# Patient Record
Sex: Female | Born: 1985
Health system: Southern US, Community
[De-identification: ages and names within clinical notes are randomized; demographics above are authoritative.]

## PROBLEM LIST (undated history)

## (undated) DIAGNOSIS — D649 Anemia, unspecified: Secondary | ICD-10-CM

## (undated) HISTORY — PX: OTHER SURGICAL HISTORY: SHX169

---

## 2009-08-05 HISTORY — PX: WISDOM TOOTH EXTRACTION: SHX21

## 2014-12-23 DIAGNOSIS — D509 Iron deficiency anemia, unspecified: Secondary | ICD-10-CM | POA: Insufficient documentation

## 2014-12-23 DIAGNOSIS — Z6839 Body mass index (BMI) 39.0-39.9, adult: Secondary | ICD-10-CM | POA: Insufficient documentation

## 2015-03-01 ENCOUNTER — Encounter (HOSPITAL_COMMUNITY): Payer: Self-pay | Admitting: *Deleted

## 2015-03-02 ENCOUNTER — Other Ambulatory Visit: Payer: Self-pay | Admitting: Obstetrics and Gynecology

## 2015-03-16 ENCOUNTER — Ambulatory Visit (HOSPITAL_COMMUNITY): Payer: BLUE CROSS/BLUE SHIELD | Admitting: Anesthesiology

## 2015-03-16 ENCOUNTER — Encounter (HOSPITAL_COMMUNITY)
Admission: RE | Disposition: A | Discharge status: Home / Self Care | Payer: Self-pay | Source: Ambulatory Visit | Attending: Obstetrics and Gynecology

## 2015-03-16 ENCOUNTER — Encounter (HOSPITAL_COMMUNITY): Payer: Self-pay | Admitting: Anesthesiology

## 2015-03-16 ENCOUNTER — Ambulatory Visit (HOSPITAL_COMMUNITY)
Admission: RE | Admit: 2015-03-16 | Discharge: 2015-03-16 | Disposition: A | Discharge status: Home / Self Care | Payer: BLUE CROSS/BLUE SHIELD | Source: Ambulatory Visit | Attending: Obstetrics and Gynecology | Admitting: Obstetrics and Gynecology

## 2015-03-16 DIAGNOSIS — Z3A00 Weeks of gestation of pregnancy not specified: Secondary | ICD-10-CM | POA: Diagnosis not present

## 2015-03-16 DIAGNOSIS — Z6838 Body mass index (BMI) 38.0-38.9, adult: Secondary | ICD-10-CM | POA: Insufficient documentation

## 2015-03-16 DIAGNOSIS — O034 Incomplete spontaneous abortion without complication: Secondary | ICD-10-CM | POA: Diagnosis not present

## 2015-03-16 DIAGNOSIS — N840 Polyp of corpus uteri: Secondary | ICD-10-CM | POA: Insufficient documentation

## 2015-03-16 DIAGNOSIS — D649 Anemia, unspecified: Secondary | ICD-10-CM | POA: Diagnosis not present

## 2015-03-16 DIAGNOSIS — N938 Other specified abnormal uterine and vaginal bleeding: Secondary | ICD-10-CM

## 2015-03-16 HISTORY — DX: Anemia, unspecified: D64.9

## 2015-03-16 HISTORY — PX: DILATATION & CURRETTAGE/HYSTEROSCOPY WITH RESECTOCOPE: SHX5572

## 2015-03-16 LAB — CBC
HCT: 36.9 % (ref 36.0–46.0)
HEMOGLOBIN: 12.2 g/dL (ref 12.0–15.0)
MCH: 28.1 pg (ref 26.0–34.0)
MCHC: 33.1 g/dL (ref 30.0–36.0)
MCV: 85 fL (ref 78.0–100.0)
Platelets: 280 10*3/uL (ref 150–400)
RBC: 4.34 MIL/uL (ref 3.87–5.11)
RDW: 13.9 % (ref 11.5–15.5)
WBC: 7.7 10*3/uL (ref 4.0–10.5)

## 2015-03-16 LAB — PREGNANCY, URINE: Preg Test, Ur: NEGATIVE

## 2015-03-16 SURGERY — DILATATION & CURETTAGE/HYSTEROSCOPY WITH RESECTOCOPE
Anesthesia: General | Site: Vagina

## 2015-03-16 MED ORDER — FENTANYL CITRATE (PF) 100 MCG/2ML IJ SOLN
INTRAMUSCULAR | Status: AC
  Filled 2015-03-16: qty 2

## 2015-03-16 MED ORDER — MIDAZOLAM HCL 2 MG/2ML IJ SOLN
INTRAMUSCULAR | Status: AC
  Filled 2015-03-16: qty 4

## 2015-03-16 MED ORDER — METOCLOPRAMIDE HCL 5 MG/ML IJ SOLN: 10.0000 mg | Freq: Once | INTRAMUSCULAR | Status: DC | PRN

## 2015-03-16 MED ORDER — SCOPOLAMINE 1 MG/3DAYS TD PT72
1.0000 | MEDICATED_PATCH | Freq: Once | TRANSDERMAL | Status: DC
  Administered 2015-03-16: 1.5 mg via TRANSDERMAL

## 2015-03-16 MED ORDER — MEPERIDINE HCL 25 MG/ML IJ SOLN: 6.2500 mg | INTRAMUSCULAR | Status: DC | PRN

## 2015-03-16 MED ORDER — HYDROCODONE-ACETAMINOPHEN 7.5-325 MG PO TABS: 1.0000 | ORAL_TABLET | Freq: Once | ORAL | Status: DC | PRN

## 2015-03-16 MED ORDER — ONDANSETRON HCL 4 MG/2ML IJ SOLN
INTRAMUSCULAR | Status: AC
  Filled 2015-03-16: qty 2

## 2015-03-16 MED ORDER — MIDAZOLAM HCL 5 MG/5ML IJ SOLN
INTRAMUSCULAR | Status: DC | PRN
  Administered 2015-03-16: 2 mg via INTRAVENOUS

## 2015-03-16 MED ORDER — GLYCINE 1.5 % IR SOLN
Status: DC | PRN
  Administered 2015-03-16 (×2): 3000 mL

## 2015-03-16 MED ORDER — FENTANYL CITRATE (PF) 100 MCG/2ML IJ SOLN
INTRAMUSCULAR | Status: AC
  Filled 2015-03-16: qty 4

## 2015-03-16 MED ORDER — DEXAMETHASONE SODIUM PHOSPHATE 4 MG/ML IJ SOLN
INTRAMUSCULAR | Status: AC
  Filled 2015-03-16: qty 1

## 2015-03-16 MED ORDER — DEXAMETHASONE SODIUM PHOSPHATE 4 MG/ML IJ SOLN
INTRAMUSCULAR | Status: DC | PRN
  Administered 2015-03-16: 4 mg via INTRAVENOUS

## 2015-03-16 MED ORDER — ONDANSETRON HCL 4 MG/2ML IJ SOLN
INTRAMUSCULAR | Status: DC | PRN
Start: 2015-03-16 — End: 2015-03-16
  Administered 2015-03-16: 4 mg via INTRAVENOUS

## 2015-03-16 MED ORDER — KETOROLAC TROMETHAMINE 30 MG/ML IJ SOLN
INTRAMUSCULAR | Status: AC
  Filled 2015-03-16: qty 1

## 2015-03-16 MED ORDER — IBUPROFEN 800 MG PO TABS: 800.0000 mg | ORAL_TABLET | Freq: Three times a day (TID) | ORAL | Status: DC | PRN

## 2015-03-16 MED ORDER — PROPOFOL 10 MG/ML IV BOLUS
INTRAVENOUS | Status: DC | PRN
  Administered 2015-03-16: 200 mg via INTRAVENOUS

## 2015-03-16 MED ORDER — PROPOFOL 10 MG/ML IV BOLUS
INTRAVENOUS | Status: AC
  Filled 2015-03-16: qty 20

## 2015-03-16 MED ORDER — LACTATED RINGERS IV SOLN
INTRAVENOUS | Status: DC
  Administered 2015-03-16 (×2): via INTRAVENOUS

## 2015-03-16 MED ORDER — FENTANYL CITRATE (PF) 100 MCG/2ML IJ SOLN
25.0000 ug | INTRAMUSCULAR | Status: DC | PRN
  Administered 2015-03-16: 50 ug via INTRAVENOUS

## 2015-03-16 MED ORDER — LIDOCAINE HCL (CARDIAC) 20 MG/ML IV SOLN
INTRAVENOUS | Status: DC | PRN
  Administered 2015-03-16: 80 mg via INTRAVENOUS

## 2015-03-16 MED ORDER — SCOPOLAMINE 1 MG/3DAYS TD PT72
MEDICATED_PATCH | TRANSDERMAL | Status: AC
  Administered 2015-03-16: 1.5 mg via TRANSDERMAL
  Filled 2015-03-16: qty 1

## 2015-03-16 MED ORDER — KETOROLAC TROMETHAMINE 30 MG/ML IJ SOLN
INTRAMUSCULAR | Status: DC | PRN
  Administered 2015-03-16: 30 mg via INTRAMUSCULAR
  Administered 2015-03-16: 30 mg via INTRAVENOUS

## 2015-03-16 MED ORDER — FENTANYL CITRATE (PF) 100 MCG/2ML IJ SOLN
INTRAMUSCULAR | Status: DC | PRN
  Administered 2015-03-16: 50 ug via INTRAVENOUS
  Administered 2015-03-16: 25 ug via INTRAVENOUS
  Administered 2015-03-16: 50 ug via INTRAVENOUS
  Administered 2015-03-16 (×3): 25 ug via INTRAVENOUS

## 2015-03-16 MED ORDER — LIDOCAINE HCL (CARDIAC) 20 MG/ML IV SOLN
INTRAVENOUS | Status: AC
  Filled 2015-03-16: qty 5

## 2015-03-16 SURGICAL SUPPLY — 17 items
CANISTER SUCT 3000ML (MISCELLANEOUS) ×2 IMPLANT
CATH ROBINSON RED A/P 16FR (CATHETERS) ×2 IMPLANT
CLOTH BEACON ORANGE TIMEOUT ST (SAFETY) ×2 IMPLANT
CONTAINER PREFILL 10% NBF 60ML (FORM) ×4 IMPLANT
ELECT REM PT RETURN 9FT ADLT (ELECTROSURGICAL) ×2
ELECTRODE REM PT RTRN 9FT ADLT (ELECTROSURGICAL) ×1 IMPLANT
GLOVE BIOGEL PI IND STRL 7.0 (GLOVE) ×2 IMPLANT
GLOVE BIOGEL PI INDICATOR 7.0 (GLOVE) ×2
GLOVE ECLIPSE 6.5 STRL STRAW (GLOVE) ×2 IMPLANT
GOWN STRL REUS W/TWL LRG LVL3 (GOWN DISPOSABLE) ×4 IMPLANT
LOOP ANGLED CUTTING 22FR (CUTTING LOOP) ×2 IMPLANT
PACK VAGINAL MINOR WOMEN LF (CUSTOM PROCEDURE TRAY) ×2 IMPLANT
PAD OB MATERNITY 4.3X12.25 (PERSONAL CARE ITEMS) ×2 IMPLANT
TOWEL OR 17X24 6PK STRL BLUE (TOWEL DISPOSABLE) ×4 IMPLANT
TUBING AQUILEX INFLOW (TUBING) ×2 IMPLANT
TUBING AQUILEX OUTFLOW (TUBING) ×2 IMPLANT
WATER STERILE IRR 1000ML POUR (IV SOLUTION) ×2 IMPLANT

## 2015-03-16 NOTE — Transfer of Care (Signed)
Immediate Anesthesia Transfer of Care Note  Patient: Cynthia Mckee  Procedure(s) Performed: Procedure(s): DILATATION & CURETTAGE/HYSTEROSCOPY WITH RESECTOCOPE (N/A)  Patient Location: PACU  Anesthesia Type:General  Level of Consciousness: awake, alert  and oriented  Airway & Oxygen Therapy: Patient Spontanous Breathing and Patient connected to nasal cannula oxygen  Post-op Assessment: Report given to RN and Post -op Vital signs reviewed and stable  Post vital signs: Reviewed and stable  Last Vitals:  Filed Vitals:   03/16/15 1252  BP: 126/80  Pulse: 63  Temp: 36.8 C  Resp: 16    Complications: No apparent anesthesia complications

## 2015-03-16 NOTE — Discharge Instructions (Signed)
CALL  IF TEMP>100.4, NOTHING PER VAGINA X 1 WK, CALL IF SOAKING A MAXI  PAD EVERY HOUR OR MORE FREQUENTLY  DISCHARGE INSTRUCTIONS: HYSTEROSCOPY  The following instructions have been prepared to help you care for yourself upon your return home.  May Remove Scop patch on or before 03/19/15  May take Ibuprofen after 8:10pm today.  May take stool softner while taking narcotic pain medication to prevent constipation.  Drink plenty of water.  Personal hygiene:  Use sanitary pads for vaginal drainage, not tampons.  Shower the day after your procedure.  NO tub baths, pools or Jacuzzis for 2-3 weeks.  Wipe front to back after using the bathroom.  Activity and limitations:  Do NOT drive or operate any equipment for 24 hours. The effects of anesthesia are still present and drowsiness may result.  Do NOT rest in bed all day.  Walking is encouraged.  Walk up and down stairs slowly.  You may resume your normal activity in one to two days or as indicated by your physician. Sexual activity: NO intercourse for at least 2 weeks after the procedure, or as indicated by your Doctor.  Diet: Eat a light meal as desired this evening. You may resume your usual diet tomorrow.  Return to Work: You may resume your work activities in one to two days or as indicated by Therapist, sports.  What to expect after your surgery: Expect to have vaginal bleeding/discharge for 2-3 days and spotting for up to 10 days. It is not unusual to have soreness for up to 1-2 weeks. You may have a slight burning sensation when you urinate for the first day. Mild cramps may continue for a couple of days. You may have a regular period in 2-6 weeks.  Call your doctor for any of the following:  Excessive vaginal bleeding or clotting, saturating and changing one pad every hour.  Inability to urinate 6 hours after discharge from hospital.  Pain not relieved by pain medication.  Fever of 100.4 F or greater.  Unusual  vaginal discharge or odor.  Return to office _________________Call for an appointment ___________________ Patients signature: ______________________ Nurses signature ________________________  Post Anesthesia Care Unit [phone]

## 2015-03-16 NOTE — Anesthesia Postprocedure Evaluation (Signed)
  Anesthesia Post-op Note  Patient: Cynthia Mckee  Procedure(s) Performed: Procedure(s): DILATATION & CURETTAGE/HYSTEROSCOPY WITH RESECTOCOPE (N/A)  Patient Location: PACU  Anesthesia Type:General  Level of Consciousness: awake, alert  and oriented  Airway and Oxygen Therapy: Patient Spontanous Breathing  Post-op Pain: none  Post-op Assessment: Post-op Vital signs reviewed, Patient's Cardiovascular Status Stable, Respiratory Function Stable, Patent Airway, No signs of Nausea or vomiting, Adequate PO intake and Pain level controlled              Post-op Vital Signs: Reviewed and stable  Last Vitals:  Filed Vitals:   03/16/15 1600  BP: 105/63  Pulse: 51  Temp: 36.7 C  Resp: 16    Complications: No apparent anesthesia complications

## 2015-03-16 NOTE — Anesthesia Preprocedure Evaluation (Signed)
Anesthesia Evaluation  Patient identified by MRN, date of birth, ID band Patient awake    Reviewed: Allergy & Precautions, NPO status , Patient's Chart, lab work & pertinent test results  Airway Mallampati: III  TM Distance: >3 FB Neck ROM: Full    Dental no notable dental hx. (+) Teeth Intact   Pulmonary neg pulmonary ROS,  breath sounds clear to auscultation  Pulmonary exam normal       Cardiovascular negative cardio ROS Normal cardiovascular examRhythm:Regular Rate:Normal     Neuro/Psych negative neurological ROS  negative psych ROS   GI/Hepatic negative GI ROS, Neg liver ROS,   Endo/Other  Morbid obesity  Renal/GU negative Renal ROS  negative genitourinary   Musculoskeletal negative musculoskeletal ROS (+)   Abdominal   Peds  Hematology  (+) anemia ,   Anesthesia Other Findings   Reproductive/Obstetrics DUB  Endometrial thickening Possible retained POC                             Anesthesia Physical Anesthesia Plan  ASA: III  Anesthesia Plan: General   Post-op Pain Management:    Induction: Intravenous  Airway Management Planned: LMA  Additional Equipment:   Intra-op Plan:   Post-operative Plan: Extubation in OR  Informed Consent: I have reviewed the patients History and Physical, chart, labs and discussed the procedure including the risks, benefits and alternatives for the proposed anesthesia with the patient or authorized representative who has indicated his/her understanding and acceptance.   Dental advisory given  Plan Discussed with: CRNA, Anesthesiologist and Surgeon  Anesthesia Plan Comments:         Anesthesia Quick Evaluation

## 2015-03-16 NOTE — Anesthesia Procedure Notes (Signed)
Procedure Name: LMA Insertion Date/Time: 03/16/2015 2:04 PM Performed by: Jhonnie Garner Pre-anesthesia Checklist: Patient identified, Emergency Drugs available, Suction available and Patient being monitored Patient Re-evaluated:Patient Re-evaluated prior to inductionOxygen Delivery Method: Circle system utilized Preoxygenation: Pre-oxygenation with 100% oxygen Intubation Type: IV induction Ventilation: Mask ventilation without difficulty LMA: LMA inserted LMA Size: 4.0 Number of attempts: 1 Placement Confirmation: positive ETCO2 and breath sounds checked- equal and bilateral Tube secured with: Tape Dental Injury: Teeth and Oropharynx as per pre-operative assessment

## 2015-03-16 NOTE — Brief Op Note (Signed)
03/16/2015  2:51 PM  PATIENT:  Cynthia Mckee  29 y.o. female  PRE-OPERATIVE DIAGNOSIS:  Possible Retained Products of Conception, Dysfunctional uterine bleeding, endometrial thickening on sonogram  POST-OPERATIVE DIAGNOSIS:   Retained Products of Conception, Dysfunctional uterine bleeding, endometrial polyps  PROCEDURE:  Procedure(s): DILATATION & CURETTAGE/HYSTEROSCOPY WITH RESECTOCOPE (N/A)  SURGEON:  Surgeon(s) and Role:    * Maxie Better, MD - Primary  PHYSICIAN ASSISTANT:   ASSISTANTS: none   ANESTHESIA:   general Findings: post fundal product of conception, endocervical canal polyps, left tubal ostia polyps EBL:  Total I/O In: 1000 [I.V.:1000] Out: 50 [Urine:50]  BLOOD ADMINISTERED:none  DRAINS: none   LOCAL MEDICATIONS USED:  NONE  SPECIMEN:  Source of Specimen:  poc, emc.endom polyps  DISPOSITION OF SPECIMEN:  PATHOLOGY  COUNTS:  YES  TOURNIQUET:  * No tourniquets in log *  DICTATION: .Other Dictation: Dictation Number [id number]  PLAN OF CARE: Discharge to home after PACU  PATIENT DISPOSITION:  PACU - hemodynamically stable.   Delay start of Pharmacological VTE agent (>24hrs) due to surgical blood loss or risk of bleeding: no

## 2015-03-17 ENCOUNTER — Encounter (HOSPITAL_COMMUNITY): Payer: Self-pay | Admitting: Obstetrics and Gynecology

## 2015-03-17 NOTE — Op Note (Signed)
Cynthia Mckee, Cynthia Mckee               ACCOUNT NO.:  [id number]  MEDICAL RECORD NO.:  [id number]  LOCATION:  [facility]                          FACILITY:  [facility]  PHYSICIAN:  Maxie Better, M.D.DATE OF BIRTH:  [birth year]  DATE OF PROCEDURE:  03/16/2015 DATE OF DISCHARGE:  03/16/2015                              OPERATIVE REPORT   PREOPERATIVE DIAGNOSES:  Dysfunctional uterine bleeding, endometrial thickening on sonogram, possible retained products of conception.  POSTOPERATIVE DIAGNOSES:  Retained products of conception, endometrial polyps, dysfunctional uterine bleeding.  PROCEDURES:  Diagnostic hysteroscopy, hysteroscopic resection of retained products of conception, and endometrial polyps,  dilation and Curettage.  ANESTHESIA; General  SURGEON:  Maxie Better, M.D.  ASSISTANT:  None.  DESCRIPTION OF PROCEDURE:  Under adequate general anesthesia, the patient was placed in a dorsal lithotomy position.  She was sterilely prepped and draped in usual fashion.  Bladder was catheterized for minimal amount of urine.  The patient had just voided prior to transfer to the operating room.  The patient was sterilely prepped and draped in usual fashion.  The bivalve speculum was then placed in the vagina. Single-tooth tenaculum was placed on anterior lip of the cervix.  The cervix was then serially dilated up to #27 [facility] dilator.  The resectoscope with a single loop was introduced into the uterine cavity without incident.  At that point in the posterior fundal aspect of the uterus, there was evidence of old chorionic villi type tissue which was then resected.  Both tubal ostia could subsequently be seen once the resection was done in the fundal region.  The hysteroscope was removed. The cavity was gently curetted and the resectoscope was once again inserted.  Additional tissue suspicious for products of conception was also removed.  When the cavity was felt to have been well evaluated  and products removed, the resectoscope was then slowly withdrawn from the uterus, at which time, the lower uterine segment polypoid lesions were noted x2 which were both removed.  The resectoscope finally was removed. The cavity was then gently curetted.  The resectoscope was inserted.  No other lesions were noted.  At which time, the procedure was terminated by removing all instruments from the vagina.  SPECIMEN: retained products of conception, endometrial polyps, and endometrial curetting was sent to Pathology.  ESTIMATED BLOOD LOSS:  15 mL.  FLUID DEFICIT:  35 mL.  COUNTS:  Sponge and instrument counts x2 was correct.  COMPLICATIONS:  None.  The patient tolerated the procedure well and was transferred to recovery in stable condition.     Maxie Better, M.D.     [place]/MEDQ  D:  03/16/2015  T:  03/17/2015  Job:  [id number]

## 2016-04-30 DIAGNOSIS — R875 Abnormal microbiological findings in specimens from female genital organs: Secondary | ICD-10-CM | POA: Diagnosis not present

## 2016-04-30 DIAGNOSIS — O2621 Pregnancy care for patient with recurrent pregnancy loss, first trimester: Secondary | ICD-10-CM | POA: Diagnosis not present

## 2016-04-30 DIAGNOSIS — Z3A12 12 weeks gestation of pregnancy: Secondary | ICD-10-CM | POA: Diagnosis not present

## 2016-04-30 DIAGNOSIS — Z3481 Encounter for supervision of other normal pregnancy, first trimester: Secondary | ICD-10-CM | POA: Diagnosis not present

## 2016-04-30 DIAGNOSIS — Z8279 Family history of other congenital malformations, deformations and chromosomal abnormalities: Secondary | ICD-10-CM | POA: Diagnosis not present

## 2016-04-30 DIAGNOSIS — Z113 Encounter for screening for infections with a predominantly sexual mode of transmission: Secondary | ICD-10-CM | POA: Diagnosis not present

## 2016-04-30 LAB — OB RESULTS CONSOLE HEPATITIS B SURFACE ANTIGEN: HEP B S AG: NEGATIVE

## 2016-04-30 LAB — OB RESULTS CONSOLE GC/CHLAMYDIA
CHLAMYDIA, DNA PROBE: NEGATIVE
Gonorrhea: NEGATIVE

## 2016-04-30 LAB — OB RESULTS CONSOLE RPR: RPR: NONREACTIVE

## 2016-04-30 LAB — OB RESULTS CONSOLE ABO/RH: RH TYPE: POSITIVE

## 2016-04-30 LAB — OB RESULTS CONSOLE HIV ANTIBODY (ROUTINE TESTING): HIV: NONREACTIVE

## 2016-04-30 LAB — OB RESULTS CONSOLE RUBELLA ANTIBODY, IGM: Rubella: IMMUNE

## 2016-04-30 LAB — OB RESULTS CONSOLE ANTIBODY SCREEN: ANTIBODY SCREEN: NEGATIVE

## 2016-05-28 DIAGNOSIS — O2622 Pregnancy care for patient with recurrent pregnancy loss, second trimester: Secondary | ICD-10-CM | POA: Diagnosis not present

## 2016-05-28 DIAGNOSIS — Z361 Encounter for antenatal screening for raised alphafetoprotein level: Secondary | ICD-10-CM | POA: Diagnosis not present

## 2016-05-28 DIAGNOSIS — Z3A16 16 weeks gestation of pregnancy: Secondary | ICD-10-CM | POA: Diagnosis not present

## 2016-06-11 DIAGNOSIS — Z363 Encounter for antenatal screening for malformations: Secondary | ICD-10-CM | POA: Diagnosis not present

## 2016-07-02 DIAGNOSIS — Z363 Encounter for antenatal screening for malformations: Secondary | ICD-10-CM | POA: Diagnosis not present

## 2016-08-05 NOTE — L&D Delivery Note (Signed)
Delivery Note At 2:08 AM a viable and healthy female was delivered via Vaginal, Spontaneous Delivery (Presentation:ROA ;vtx  ).  APGAR: 7, 8; weight pending .   Placenta status:spontaneous, intact not sent , .  Cord: CAN x1 reducible with the following complications: .  Cord pH: none  Anesthesia:  epidural Episiotomy: None Lacerations: Sulcus;Labial Suture Repair: 3.0 chromic Est. Blood Loss (mL): 350  Mom to postpartum.  Baby to Couplet care / Skin to Skin.  Samael Blades A 11/01/2016, 2:38 AM

## 2016-08-29 DIAGNOSIS — Z3A30 30 weeks gestation of pregnancy: Secondary | ICD-10-CM | POA: Diagnosis not present

## 2016-08-29 DIAGNOSIS — Z3689 Encounter for other specified antenatal screening: Secondary | ICD-10-CM | POA: Diagnosis not present

## 2016-08-29 DIAGNOSIS — O2623 Pregnancy care for patient with recurrent pregnancy loss, third trimester: Secondary | ICD-10-CM | POA: Diagnosis not present

## 2016-09-09 DIAGNOSIS — Z23 Encounter for immunization: Secondary | ICD-10-CM | POA: Diagnosis not present

## 2016-09-24 ENCOUNTER — Encounter (HOSPITAL_COMMUNITY): Payer: Self-pay

## 2016-09-24 DIAGNOSIS — Z3A33 33 weeks gestation of pregnancy: Secondary | ICD-10-CM | POA: Diagnosis not present

## 2016-09-24 DIAGNOSIS — O3663X0 Maternal care for excessive fetal growth, third trimester, not applicable or unspecified: Secondary | ICD-10-CM | POA: Diagnosis not present

## 2016-10-02 ENCOUNTER — Encounter (HOSPITAL_COMMUNITY): Payer: Self-pay

## 2016-10-02 ENCOUNTER — Other Ambulatory Visit (HOSPITAL_COMMUNITY): Payer: Self-pay | Admitting: Obstetrics and Gynecology

## 2016-10-02 DIAGNOSIS — Z3A35 35 weeks gestation of pregnancy: Secondary | ICD-10-CM

## 2016-10-02 DIAGNOSIS — IMO0002 Reserved for concepts with insufficient information to code with codable children: Secondary | ICD-10-CM

## 2016-10-02 DIAGNOSIS — Z3689 Encounter for other specified antenatal screening: Secondary | ICD-10-CM

## 2016-10-03 ENCOUNTER — Ambulatory Visit (HOSPITAL_COMMUNITY)
Admission: RE | Admit: 2016-10-03 | Discharge: 2016-10-03 | Disposition: A | Discharge status: Home / Self Care | Payer: 59 | Source: Ambulatory Visit | Attending: Obstetrics and Gynecology | Admitting: Obstetrics and Gynecology

## 2016-10-03 ENCOUNTER — Encounter (HOSPITAL_COMMUNITY): Payer: Self-pay

## 2016-10-03 DIAGNOSIS — Z3689 Encounter for other specified antenatal screening: Secondary | ICD-10-CM | POA: Insufficient documentation

## 2016-10-03 DIAGNOSIS — Q897 Multiple congenital malformations, not elsewhere classified: Secondary | ICD-10-CM | POA: Diagnosis not present

## 2016-10-03 DIAGNOSIS — Z363 Encounter for antenatal screening for malformations: Secondary | ICD-10-CM | POA: Diagnosis not present

## 2016-10-03 DIAGNOSIS — IMO0002 Reserved for concepts with insufficient information to code with codable children: Secondary | ICD-10-CM

## 2016-10-03 DIAGNOSIS — O283 Abnormal ultrasonic finding on antenatal screening of mother: Secondary | ICD-10-CM | POA: Diagnosis not present

## 2016-10-03 DIAGNOSIS — Z3A35 35 weeks gestation of pregnancy: Secondary | ICD-10-CM | POA: Diagnosis not present

## 2016-10-07 DIAGNOSIS — Z3A35 35 weeks gestation of pregnancy: Secondary | ICD-10-CM | POA: Diagnosis not present

## 2016-10-07 DIAGNOSIS — O3663X0 Maternal care for excessive fetal growth, third trimester, not applicable or unspecified: Secondary | ICD-10-CM | POA: Diagnosis not present

## 2016-10-07 DIAGNOSIS — Z3685 Encounter for antenatal screening for Streptococcus B: Secondary | ICD-10-CM | POA: Diagnosis not present

## 2016-10-07 LAB — OB RESULTS CONSOLE GBS: GBS: NEGATIVE

## 2016-10-14 DIAGNOSIS — O3693X0 Maternal care for fetal problem, unspecified, third trimester, not applicable or unspecified: Secondary | ICD-10-CM | POA: Insufficient documentation

## 2016-10-14 DIAGNOSIS — O358XX0 Maternal care for other (suspected) fetal abnormality and damage, not applicable or unspecified: Secondary | ICD-10-CM | POA: Diagnosis not present

## 2016-10-14 DIAGNOSIS — Z3A36 36 weeks gestation of pregnancy: Secondary | ICD-10-CM | POA: Diagnosis not present

## 2016-10-16 DIAGNOSIS — O3663X0 Maternal care for excessive fetal growth, third trimester, not applicable or unspecified: Secondary | ICD-10-CM | POA: Diagnosis not present

## 2016-10-16 DIAGNOSIS — Z3A36 36 weeks gestation of pregnancy: Secondary | ICD-10-CM | POA: Diagnosis not present

## 2016-10-21 DIAGNOSIS — O403XX0 Polyhydramnios, third trimester, not applicable or unspecified: Secondary | ICD-10-CM | POA: Diagnosis not present

## 2016-10-21 DIAGNOSIS — Z3A37 37 weeks gestation of pregnancy: Secondary | ICD-10-CM | POA: Diagnosis not present

## 2016-10-28 DIAGNOSIS — O403XX0 Polyhydramnios, third trimester, not applicable or unspecified: Secondary | ICD-10-CM | POA: Diagnosis not present

## 2016-10-28 DIAGNOSIS — Z3A38 38 weeks gestation of pregnancy: Secondary | ICD-10-CM | POA: Diagnosis not present

## 2016-10-31 ENCOUNTER — Other Ambulatory Visit: Payer: Self-pay | Admitting: Obstetrics and Gynecology

## 2016-10-31 ENCOUNTER — Inpatient Hospital Stay (HOSPITAL_COMMUNITY): Payer: 59 | Admitting: Anesthesiology

## 2016-10-31 ENCOUNTER — Inpatient Hospital Stay (HOSPITAL_COMMUNITY)
Admission: AD | Admit: 2016-10-31 | Discharge: 2016-11-02 | DRG: 775 | Disposition: A | Discharge status: Home / Self Care | Payer: 59 | Source: Ambulatory Visit | Attending: Obstetrics and Gynecology | Admitting: Obstetrics and Gynecology

## 2016-10-31 ENCOUNTER — Encounter (HOSPITAL_COMMUNITY): Payer: Self-pay | Admitting: *Deleted

## 2016-10-31 DIAGNOSIS — Z3A39 39 weeks gestation of pregnancy: Secondary | ICD-10-CM | POA: Diagnosis not present

## 2016-10-31 DIAGNOSIS — O403XX0 Polyhydramnios, third trimester, not applicable or unspecified: Secondary | ICD-10-CM | POA: Diagnosis present

## 2016-10-31 DIAGNOSIS — O358XX0 Maternal care for other (suspected) fetal abnormality and damage, not applicable or unspecified: Secondary | ICD-10-CM | POA: Diagnosis present

## 2016-10-31 DIAGNOSIS — Z3490 Encounter for supervision of normal pregnancy, unspecified, unspecified trimester: Secondary | ICD-10-CM

## 2016-10-31 LAB — CBC
HEMATOCRIT: 33.7 % — AB (ref 36.0–46.0)
Hemoglobin: 11.3 g/dL — ABNORMAL LOW (ref 12.0–15.0)
MCH: 27.8 pg (ref 26.0–34.0)
MCHC: 33.5 g/dL (ref 30.0–36.0)
MCV: 82.8 fL (ref 78.0–100.0)
PLATELETS: 259 10*3/uL (ref 150–400)
RBC: 4.07 MIL/uL (ref 3.87–5.11)
RDW: 13.9 % (ref 11.5–15.5)
WBC: 10.9 10*3/uL — AB (ref 4.0–10.5)

## 2016-10-31 LAB — TYPE AND SCREEN
ABO/RH(D): O POS
ANTIBODY SCREEN: NEGATIVE

## 2016-10-31 LAB — ABO/RH: ABO/RH(D): O POS

## 2016-10-31 MED ORDER — DIPHENHYDRAMINE HCL 50 MG/ML IJ SOLN: 12.5000 mg | INTRAMUSCULAR | Status: DC | PRN

## 2016-10-31 MED ORDER — OXYTOCIN BOLUS FROM INFUSION
500.0000 mL | Freq: Once | INTRAVENOUS | Status: AC
Start: 2016-10-31 — End: 2016-11-01
  Administered 2016-11-01: 500 mL via INTRAVENOUS

## 2016-10-31 MED ORDER — OXYTOCIN 40 UNITS IN LACTATED RINGERS INFUSION - SIMPLE MED: 2.5000 [IU]/h | INTRAVENOUS | Status: DC

## 2016-10-31 MED ORDER — PHENYLEPHRINE 40 MCG/ML (10ML) SYRINGE FOR IV PUSH (FOR BLOOD PRESSURE SUPPORT)
80.0000 ug | PREFILLED_SYRINGE | INTRAVENOUS | Status: DC | PRN
  Filled 2016-10-31: qty 5
  Filled 2016-10-31: qty 10

## 2016-10-31 MED ORDER — LACTATED RINGERS IV SOLN: 500.0000 mL | INTRAVENOUS | Status: DC | PRN

## 2016-10-31 MED ORDER — LACTATED RINGERS IV SOLN: 500.0000 mL | Freq: Once | INTRAVENOUS | Status: DC

## 2016-10-31 MED ORDER — SOD CITRATE-CITRIC ACID 500-334 MG/5ML PO SOLN: 30.0000 mL | ORAL | Status: DC | PRN

## 2016-10-31 MED ORDER — PHENYLEPHRINE 40 MCG/ML (10ML) SYRINGE FOR IV PUSH (FOR BLOOD PRESSURE SUPPORT)
80.0000 ug | PREFILLED_SYRINGE | INTRAVENOUS | Status: DC | PRN
  Filled 2016-10-31: qty 5

## 2016-10-31 MED ORDER — EPHEDRINE 5 MG/ML INJ
10.0000 mg | INTRAVENOUS | Status: DC | PRN
  Filled 2016-10-31: qty 2

## 2016-10-31 MED ORDER — ACETAMINOPHEN 325 MG PO TABS: 650.0000 mg | ORAL_TABLET | ORAL | Status: DC | PRN

## 2016-10-31 MED ORDER — LIDOCAINE HCL (PF) 1 % IJ SOLN
30.0000 mL | INTRAMUSCULAR | Status: DC | PRN
  Administered 2016-11-01: 30 mL via SUBCUTANEOUS
  Filled 2016-10-31: qty 30

## 2016-10-31 MED ORDER — OXYTOCIN 40 UNITS IN LACTATED RINGERS INFUSION - SIMPLE MED
1.0000 m[IU]/min | INTRAVENOUS | Status: DC
  Administered 2016-10-31: 2 m[IU]/min via INTRAVENOUS
  Filled 2016-10-31: qty 1000

## 2016-10-31 MED ORDER — EPHEDRINE 5 MG/ML INJ: 10.0000 mg | INTRAVENOUS | Status: DC | PRN

## 2016-10-31 MED ORDER — LACTATED RINGERS IV SOLN
INTRAVENOUS | Status: DC
  Administered 2016-10-31 (×2): via INTRAVENOUS

## 2016-10-31 MED ORDER — LIDOCAINE HCL (PF) 1 % IJ SOLN
INTRAMUSCULAR | Status: DC | PRN
  Administered 2016-10-31: 4 mL via EPIDURAL

## 2016-10-31 MED ORDER — TERBUTALINE SULFATE 1 MG/ML IJ SOLN
0.2500 mg | Freq: Once | INTRAMUSCULAR | Status: DC | PRN
  Filled 2016-10-31: qty 1

## 2016-10-31 MED ORDER — LACTATED RINGERS IV SOLN
500.0000 mL | Freq: Once | INTRAVENOUS | Status: AC
  Administered 2016-10-31: 500 mL via INTRAVENOUS

## 2016-10-31 MED ORDER — FENTANYL 2.5 MCG/ML BUPIVACAINE 1/10 % EPIDURAL INFUSION (WH - ANES)
14.0000 mL/h | INTRAMUSCULAR | Status: DC | PRN
  Administered 2016-10-31: 14 mL/h via EPIDURAL
  Filled 2016-10-31: qty 100

## 2016-10-31 MED ORDER — PHENYLEPHRINE 40 MCG/ML (10ML) SYRINGE FOR IV PUSH (FOR BLOOD PRESSURE SUPPORT): 80.0000 ug | PREFILLED_SYRINGE | INTRAVENOUS | Status: DC | PRN

## 2016-10-31 MED ORDER — ONDANSETRON HCL 4 MG/2ML IJ SOLN: 4.0000 mg | Freq: Four times a day (QID) | INTRAMUSCULAR | Status: DC | PRN

## 2016-10-31 NOTE — H&P (Signed)
Cynthia Mckee is a 31 y.o. female presenting for IOL @ 39 weeks due to polyhydramnios( AFI 34). PNC also complicated by 8.7 cm fetal cyst OB History    Gravida Para Term Preterm AB Living   5       4     SAB TAB Ectopic Multiple Live Births   4             Past Medical History:  Diagnosis Date  . Anemia    one year ago, last check was normal   Past Surgical History:  Procedure Laterality Date  . DILATATION & CURRETTAGE/HYSTEROSCOPY WITH RESECTOCOPE N/A 03/16/2015   Procedure: DILATATION & CURETTAGE/HYSTEROSCOPY WITH RESECTOCOPE;  Surgeon: Maxie BetterSheronette Zayyan Mullen, MD;  Location: [facility];  Service: Gynecology;  Laterality: N/A;  . WISDOM TOOTH EXTRACTION  2011   Family History: family history is not on file. Social History:  reports that she has never smoked. She has never used smokeless tobacco. She reports that she does not drink alcohol or use drugs.     Maternal Diabetes: No Genetic Screening: Normal( absent NB). Nl panaroma Maternal Ultrasounds/Referrals: Normal Fetal Ultrasounds or other Referrals:  Referred to Materal Fetal Medicine , Other:  Maternal Substance Abuse:  No Significant Maternal Medications:  None Significant Maternal Lab Results:  Lab values include: Group B Strep negative Other Comments:  large fetal cyst ? ovarian  Review of Systems  All other systems reviewed and are negative.  Maternal Medical History:  Fetal activity: Perceived fetal activity is normal.    Prenatal complications: Polyhydramnios.   Prenatal Complications - Diabetes: none.      Last menstrual period 02/01/2016. Maternal Exam:  Abdomen: Patient reports no abdominal tenderness. Fetal presentation: vertex  Pelvis: adequate for delivery.   Cervix: Cervix evaluated by digital exam.     Physical Exam  Constitutional: She is oriented to person, place, and time. She appears well-developed and well-nourished.  HENT:  Head: Atraumatic.  Eyes: EOM are normal.  Neck: Neck supple.   Cardiovascular: Regular rhythm.   Murmur heard. Respiratory: Breath sounds normal.  GI: Soft.  Musculoskeletal: Normal range of motion.  Neurological: She is alert and oriented to person, place, and time.  Skin: Skin is warm and dry.    Prenatal labs: ABO, Rh: O/Positive/-- (09/26 0000) Antibody: Negative (09/26 0000) Rubella: Immune (09/26 0000) RPR: Nonreactive (09/26 0000)  HBsAg: Negative (09/26 0000)  HIV: Non-reactive (09/26 0000)  GBS: Negative (03/05 0000)   Assessment/Plan: Polyhydramnios Term gestation Fetal cyst P) admit routine labs. Pitocin induction. Analgesic prn Angas Isabell A 10/31/2016, 4:59 PM

## 2016-10-31 NOTE — Progress Notes (Signed)
S; some ctx  O: pitocin VE 4-5/80-90/-1 AROM clear fluid ISE/IUPC placed  Tracing: baseline 140 (+) accels Ctx q 2-3 mins  IMP: polyhydramnios Term gestation Fetal cyst P) analgesic prn. Cont pitocin

## 2016-10-31 NOTE — Anesthesia Pain Management Evaluation Note (Signed)
  CRNA Pain Management Visit Note  Patient: Cynthia Mckee, 31 y.o., female  "Hello I am a [facility] the anesthesia [facility]. We have an anesthesia team available at all times to provide care throughout the hospital, including epidural management and anesthesia for C-section. I don't know your plan for the delivery whether it a natural birth, water birth, IV sedation, nitrous supplementation, doula or epidural, but we want to meet your pain goals."   1.Was your pain managed to your expectations on prior hospitalizations?   No prior hospitalizations  2.What is your expectation for pain management during this hospitalization?     Epidural  3.How can we help you reach that goal?   Record the patient's initial score and the patient's pain goal.   Pain: 1  Pain Goal: 8 [facility] wants you to be able to say your pain was always managed very well.  Cynthia Mckee,Cynthia Mckee 10/31/2016

## 2016-10-31 NOTE — Anesthesia Procedure Notes (Signed)
Epidural Patient location during procedure: OB Start time: 10/31/2016 11:39 PM End time: 10/31/2016 11:45 PM  Staffing Anesthesiologist: Shona SimpsonHOLLIS, Arleta Ostrum D Performed: anesthesiologist   Preanesthetic Checklist Completed: patient identified, site marked, surgical consent, pre-op evaluation, timeout performed, IV checked, risks and benefits discussed and monitors and equipment checked  Epidural Patient position: sitting Prep: ChloraPrep Patient monitoring: heart rate, continuous pulse ox and blood pressure Approach: midline Location: L3-L4 Injection technique: LOR saline  Needle:  Needle type: Tuohy  Needle gauge: 17 G Needle length: 9 cm Catheter type: closed end flexible Catheter size: 20 Guage Test dose: negative and 1.5% lidocaine  Assessment Events: blood not aspirated, injection not painful, no injection resistance and no paresthesia  Additional Notes LOR @ 6.5  Patient identified. Risks/Benefits/Options discussed with patient including but not limited to bleeding, infection, nerve damage, paralysis, failed block, incomplete pain control, headache, blood pressure changes, nausea, vomiting, reactions to medications, itching and postpartum back pain. Confirmed with bedside nurse the patient's most recent platelet count. Confirmed with patient that they are not currently taking any anticoagulation, have any bleeding history or any family history of bleeding disorders. Patient expressed understanding and wished to proceed. All questions were answered. Sterile technique was used throughout the entire procedure. Please see nursing notes for vital signs. Test dose was given through epidural catheter and negative prior to continuing to dose epidural or start infusion. Warning signs of high block given to the patient including shortness of breath, tingling/numbness in hands, complete motor block, or any concerning symptoms with instructions to call for help. Patient was given instructions on  fall risk and not to get out of bed. All questions and concerns addressed with instructions to call with any issues or inadequate analgesia.    Reason for block:procedure for pain

## 2016-10-31 NOTE — Anesthesia Preprocedure Evaluation (Signed)
Anesthesia Evaluation  Patient identified by MRN, date of birth, ID band Patient awake    Reviewed: Allergy & Precautions, Patient's Chart, lab work & pertinent test results  Airway Mallampati: III       Dental  (+) Teeth Intact   Pulmonary neg pulmonary ROS,    breath sounds clear to auscultation       Cardiovascular negative cardio ROS   Rhythm:Regular Rate:Normal     Neuro/Psych negative neurological ROS  negative psych ROS   GI/Hepatic negative GI ROS, Neg liver ROS,   Endo/Other  negative endocrine ROS  Renal/GU negative Renal ROS  negative genitourinary   Musculoskeletal negative musculoskeletal ROS (+)   Abdominal   Peds negative pediatric ROS (+)  Hematology negative hematology ROS (+)   Anesthesia Other Findings   Reproductive/Obstetrics (+) Pregnancy                             Lab Results  Component Value Date   WBC 10.9 (H) 10/31/2016   HGB 11.3 (L) 10/31/2016   HCT 33.7 (L) 10/31/2016   MCV 82.8 10/31/2016   PLT 259 10/31/2016     Anesthesia Physical Anesthesia Plan  ASA: III  Anesthesia Plan: Epidural   Post-op Pain Management:    Induction:   Airway Management Planned:   Additional Equipment:   Intra-op Plan:   Post-operative Plan:   Informed Consent: I have reviewed the patients History and Physical, chart, labs and discussed the procedure including the risks, benefits and alternatives for the proposed anesthesia with the patient or authorized representative who has indicated his/her understanding and acceptance.     Plan Discussed with: CRNA  Anesthesia Plan Comments:         Anesthesia Quick Evaluation

## 2016-11-01 ENCOUNTER — Encounter (HOSPITAL_COMMUNITY): Payer: Self-pay

## 2016-11-01 LAB — CBC
HEMATOCRIT: 30.5 % — AB (ref 36.0–46.0)
Hemoglobin: 9.9 g/dL — ABNORMAL LOW (ref 12.0–15.0)
MCH: 27.3 pg (ref 26.0–34.0)
MCHC: 32.5 g/dL (ref 30.0–36.0)
MCV: 84 fL (ref 78.0–100.0)
Platelets: 213 10*3/uL (ref 150–400)
RBC: 3.63 MIL/uL — ABNORMAL LOW (ref 3.87–5.11)
RDW: 13.7 % (ref 11.5–15.5)
WBC: 13.7 10*3/uL — AB (ref 4.0–10.5)

## 2016-11-01 LAB — RPR: RPR: NONREACTIVE

## 2016-11-01 MED ORDER — DIPHENHYDRAMINE HCL 25 MG PO CAPS: 25.0000 mg | ORAL_CAPSULE | Freq: Four times a day (QID) | ORAL | Status: DC | PRN

## 2016-11-01 MED ORDER — BENZOCAINE-MENTHOL 20-0.5 % EX AERO
1.0000 "application " | INHALATION_SPRAY | CUTANEOUS | Status: DC | PRN
  Administered 2016-11-01: 1 via TOPICAL
  Filled 2016-11-01: qty 56

## 2016-11-01 MED ORDER — ONDANSETRON HCL 4 MG PO TABS: 4.0000 mg | ORAL_TABLET | ORAL | Status: DC | PRN

## 2016-11-01 MED ORDER — ACETAMINOPHEN 325 MG PO TABS: 650.0000 mg | ORAL_TABLET | ORAL | Status: DC | PRN

## 2016-11-01 MED ORDER — IBUPROFEN 600 MG PO TABS
600.0000 mg | ORAL_TABLET | Freq: Four times a day (QID) | ORAL | Status: DC
  Administered 2016-11-01 – 2016-11-02 (×6): 600 mg via ORAL
  Filled 2016-11-01 (×6): qty 1

## 2016-11-01 MED ORDER — OXYCODONE HCL 5 MG PO TABS: 5.0000 mg | ORAL_TABLET | ORAL | Status: DC | PRN

## 2016-11-01 MED ORDER — ONDANSETRON HCL 4 MG/2ML IJ SOLN: 4.0000 mg | INTRAMUSCULAR | Status: DC | PRN

## 2016-11-01 MED ORDER — COCONUT OIL OIL: 1.0000 "application " | TOPICAL_OIL | Status: DC | PRN

## 2016-11-01 MED ORDER — SENNOSIDES-DOCUSATE SODIUM 8.6-50 MG PO TABS
2.0000 | ORAL_TABLET | ORAL | Status: DC
  Administered 2016-11-01: 2 via ORAL
  Filled 2016-11-01: qty 2

## 2016-11-01 MED ORDER — OXYCODONE HCL 5 MG PO TABS: 10.0000 mg | ORAL_TABLET | ORAL | Status: DC | PRN

## 2016-11-01 MED ORDER — WITCH HAZEL-GLYCERIN EX PADS: 1.0000 "application " | MEDICATED_PAD | CUTANEOUS | Status: DC | PRN

## 2016-11-01 MED ORDER — DIBUCAINE 1 % RE OINT
1.0000 "application " | TOPICAL_OINTMENT | RECTAL | Status: DC | PRN
  Administered 2016-11-01: 1 via RECTAL
  Filled 2016-11-01: qty 28

## 2016-11-01 MED ORDER — ZOLPIDEM TARTRATE 5 MG PO TABS: 5.0000 mg | ORAL_TABLET | Freq: Every evening | ORAL | Status: DC | PRN

## 2016-11-01 MED ORDER — PRENATAL MULTIVITAMIN CH
1.0000 | ORAL_TABLET | Freq: Every day | ORAL | Status: DC
  Administered 2016-11-01: 1 via ORAL
  Filled 2016-11-01 (×2): qty 1

## 2016-11-01 MED ORDER — SIMETHICONE 80 MG PO CHEW: 80.0000 mg | CHEWABLE_TABLET | ORAL | Status: DC | PRN

## 2016-11-01 MED ORDER — FERROUS SULFATE 325 (65 FE) MG PO TABS
325.0000 mg | ORAL_TABLET | Freq: Two times a day (BID) | ORAL | Status: DC
  Administered 2016-11-01 – 2016-11-02 (×3): 325 mg via ORAL
  Filled 2016-11-01 (×3): qty 1

## 2016-11-01 NOTE — Anesthesia Postprocedure Evaluation (Signed)
Anesthesia Post Note  Patient: Cynthia Mckee  Procedure(s) Performed: * No procedures listed *  Patient location during evaluation: Mother Baby Anesthesia Type: Epidural Level of consciousness: awake and alert and oriented Pain management: pain level controlled Vital Signs Assessment: post-procedure vital signs reviewed and stable Respiratory status: spontaneous breathing and nonlabored ventilation Cardiovascular status: stable Postop Assessment: no headache, no backache, epidural receding, patient able to bend at knees, no signs of nausea or vomiting and adequate PO intake Anesthetic complications: no        Last Vitals:  Vitals:   11/01/16 0415 11/01/16 0534  BP: 129/70 125/73  Pulse: 100 98  Resp: 18 18  Temp: 36.8 C 37.1 C    Last Pain:  Vitals:   11/01/16 0534  TempSrc: Oral  PainSc:    Pain Goal:                 Laban Emperor

## 2016-11-01 NOTE — Progress Notes (Signed)
PPD # 1 SVD Information for the patient's newborn:  Keysi, Oelkers [[id number]]  female    breast feeding   Baby name: Aaleyah - large pelvic cyst, [place] this AM  S:  Reports feeling well.             Tolerating po/ No nausea or vomiting             Bleeding is decreased             Pain controlled with ibuprofen (OTC)             Up ad lib / ambulatory / voiding without difficulties        O:  A & O x 3, in no apparent distress              VS:  Vitals:   11/01/16 0315 11/01/16 0415 11/01/16 0534 11/01/16 0934  BP: 117/68 129/70 125/73 136/61  Pulse: 97 100 98 (!) 103  Resp:  Temp:  98.3 F (36.8 C) 98.8 F (37.1 C) 98.1 F (36.7 C)  TempSrc:  Oral Oral Oral  SpO2:  100%  98%  Weight:      Height:        LABS:  Recent Labs  10/31/16 1700 11/01/16 0946  WBC 10.9* 13.7*  HGB 11.3* 9.9*  HCT 33.7* 30.5*  PLT 259 213    Blood type: --/--/O POS, O POS (03/29 1700)  Rubella: Immune (09/26 0000)   I&O: I/O last 3 completed shifts: In: -  Out: 550 [Urine:200; Blood:350]          No intake/output data recorded.  Lungs: Clear and unlabored  Heart: regular rate and rhythm / no murmurs  Abdomen: soft, non-tender, non-distended             Fundus: firm, non-tender, U@  Perineum: no edema  Lochia: small  Extremities: trace edema, no calf pain or tenderness    A/P: PPD # 1 31 y.o., [id number]   Active Problems:   Postpartum care following vaginal delivery 3/29   Doing well - stable status  Routine post partum orders  Anticipate discharge tomorrow    Neta Mends, MSN, CNM 11/01/2016, 10:36 AM

## 2016-11-01 NOTE — Lactation Note (Signed)
This note was copied from a baby's chart. Lactation Consultation Note  Patient Name: Cynthia Mckee [id number] Date: 11/01/2016 Reason for consult: Initial assessment Breastfeeding consultation services and support information given and reviewed.  This is mom's first baby and newborn is 59 hours old.  Mom states she has received assist with breastfeeding and baby latches easily and nurses well.  Discussed normal first 24 hours and cluster feeding on day 2-3.  Instructed to feed with any feeding cue. Encouraged to call for assist/concerns prn.  Maternal Data Does the patient have breastfeeding experience prior to this delivery?: No  Feeding    LATCH Score/Interventions                      Lactation Tools Discussed/Used     Consult Status Consult Status: Follow-up Date: 11/02/16 Follow-up type: In-patient    [name] 11/01/2016, 1:49 PM

## 2016-11-02 MED ORDER — FERROUS SULFATE 325 (65 FE) MG PO TABS: 325.0000 mg | ORAL_TABLET | Freq: Every day | ORAL | 0 refills | Status: DC

## 2016-11-02 MED ORDER — IBUPROFEN 600 MG PO TABS: 600.0000 mg | ORAL_TABLET | Freq: Four times a day (QID) | ORAL | 0 refills | Status: DC

## 2016-11-02 MED ORDER — MAGNESIUM OXIDE 400 MG PO TABS: 400.0000 mg | ORAL_TABLET | Freq: Every day | ORAL | 0 refills | Status: DC

## 2016-11-02 NOTE — Discharge Summary (Signed)
Obstetric Discharge Summary Reason for Admission: induction of labor - polyhydramnios, fetal cyst Prenatal Procedures: NST and ultrasound Intrapartum Procedures: spontaneous vaginal delivery Postpartum Procedures: none Complications-Operative and Postpartum: vaginal sulcus repair Hemoglobin  Date Value Ref Range Status  11/01/2016 9.9 (L) 12.0 - 15.0 g/dL Final   HCT  Date Value Ref Range Status  11/01/2016 30.5 (L) 36.0 - 46.0 % Final    Physical Exam:  General: alert, cooperative and no distress Lochia: appropriate Uterine Fundus: firm Incision: healing well DVT Evaluation: No evidence of DVT seen on physical exam.  Discharge Diagnoses: Term Pregnancy-delivered  Discharge Information: Date: 11/02/2016 Activity: pelvic rest Diet: routine Medications: PNV, Ibuprofen, Iron and magnesium Condition: stable Instructions: refer to practice specific booklet Discharge to: home Follow-up Information    Cynthia Brew A, MD. Schedule an appointment as soon as possible for Mckee visit in 6 week(s).   Specialty:  Obstetrics and Gynecology Contact information: [address] [place] [address] [phone]           Newborn Data: Live born female  Birth Weight: 7 lb 4 oz (3289 g) APGAR: 7, 8  Home with mother.  Cynthia Mckee 11/02/2016, 11:22 AM

## 2016-11-02 NOTE — Discharge Instructions (Signed)
Iron-Rich Diet ° °Iron is a mineral that helps your body to produce hemoglobin. Hemoglobin is a protein in your red blood cells that carries oxygen to your body's tissues. Eating too little iron may cause you to feel weak and tired, and it can increase your risk for infection. Eating enough iron is necessary for your body's metabolism, muscle function, and nervous system. °Iron is naturally found in many foods. It can also be added to foods or fortified in foods. There are two types of dietary iron: °· Heme iron. Heme iron is absorbed by the body more easily than nonheme iron. Heme iron is found in meat, poultry, and fish. °· Nonheme iron. Nonheme iron is found in dietary supplements, iron-fortified grains, beans, and vegetables. °You may need to follow an iron-rich diet if: °· You have been diagnosed with iron deficiency or iron-deficiency anemia. °· You have a condition that prevents you from absorbing dietary iron, such as: °¨ Infection in your intestines. °¨ Celiac disease. This involves long-lasting (chronic) inflammation of your intestines. °· You do not eat enough iron. °· You eat a diet that is high in foods that impair iron absorption. °· You have lost a lot of blood. °· You have heavy bleeding during your menstrual cycle. °· You are pregnant. °What is my plan? °Your health care provider may help you to determine how much iron you need per day based on your condition. Generally, when a person consumes sufficient amounts of iron in the diet, the following iron needs are met: °· Men. °¨ 14-18 years old: 11 mg per day. °¨ 19-50 years old: 8 mg per day. °· Women. °¨ 14-18 years old: 15 mg per day. °¨ 19-50 years old: 18 mg per day. °¨ Over 50 years old: 8 mg per day. °¨ Pregnant women: 27 mg per day. °¨ Breastfeeding women: 9 mg per day. °What do I need to know about an iron-rich diet? °· Eat fresh fruits and vegetables that are high in vitamin C along with foods that are high in iron. This will help increase  the amount of iron that your body absorbs from food, especially with foods containing nonheme iron. Foods that are high in vitamin C include oranges, peppers, tomatoes, and mango. °· Take iron supplements only as directed by your health care provider. Overdose of iron can be life-threatening. If you were prescribed iron supplements, take them with orange juice or a vitamin C supplement. °· Cook foods in pots and pans that are made from iron. °· Eat nonheme iron-containing foods alongside foods that are high in heme iron. This helps to improve your iron absorption. °· Certain foods and drinks contain compounds that impair iron absorption. Avoid eating these foods in the same meal as iron-rich foods or with iron supplements. These include: °¨ Coffee, black tea, and red wine. °¨ Milk, dairy products, and foods that are high in calcium. °¨ Beans, soybeans, and peas. °¨ Whole grains. °· When eating foods that contain both nonheme iron and compounds that impair iron absorption, follow these tips to absorb iron better. °¨ Soak beans overnight before cooking. °¨ Soak whole grains overnight and drain them before using. °¨ Ferment flours before baking, such as using yeast in bread dough. °What foods can I eat? °Grains  °Iron-fortified breakfast cereal. Iron-fortified whole-wheat bread. Enriched rice. Sprouted grains. °Vegetables  °Spinach. Potatoes with skin. Green peas. Broccoli. Red and green bell peppers. Fermented vegetables. °Fruits  °Prunes. Raisins. Oranges. Strawberries. Mango. Grapefruit. °Meats and Other Protein   Sources  °Beef liver. Oysters. Beef. Shrimp. Turkey. Chicken. Tuna. Sardines. Chickpeas. Nuts. Tofu. °Beverages  °Tomato juice. Fresh orange juice. Prune juice. Hibiscus tea. Fortified instant breakfast shakes. °Condiments  °Tahini. Fermented soy sauce. °Sweets and Desserts  °Black-strap molasses. °Other  °Wheat germ. °The items listed above may not be a complete list of recommended foods or beverages.  Contact your dietitian for more options.  °What foods are not recommended? °Grains  °Whole grains. Bran cereal. Bran flour. Oats. °Vegetables  °Artichokes. Brussels sprouts. Kale. °Fruits  °Blueberries. Raspberries. Strawberries. Figs. °Meats and Other Protein Sources  °Soybeans. Products made from soy protein. °Dairy  °Milk. Cream. Cheese. Yogurt. Cottage cheese. °Beverages  °Coffee. Black tea. Red wine. °Sweets and Desserts  °Cocoa. Chocolate. Ice cream. °Other  °Basil. Oregano. Parsley. °The items listed above may not be a complete list of foods and beverages to avoid. Contact your dietitian for more information.  °This information is not intended to replace advice given to you by your health care provider. Make sure you discuss any questions you have with your health care provider. °Document Released: 03/05/2005 Document Revised: 02/09/2016 Document Reviewed: 02/16/2014 °Elsevier Interactive Patient Education © 2017 Elsevier Inc. ° ° ° °

## 2016-11-02 NOTE — Progress Notes (Signed)
PPD 2 SVD  S:  Reports feeling well - ready to go home             Tolerating po/ No nausea or vomiting             Bleeding is light             Pain controlled with motrin             Up ad lib / ambulatory / voiding QS  Newborn breast feeding   O:               VS: BP 131/78   Pulse 83   Temp 98.1 F (36.7 C) (Oral)   Resp 18   Ht  (1.676 m)   Wt 119.3 kg (263 lb)   LMP 02/01/2016   SpO2 98%   Breastfeeding? Unknown   BMI 42.45 kg/m    LABS:              Recent Labs  10/31/16 1700 11/01/16 0946  WBC 10.9* 13.7*  HGB 11.3* 9.9*  PLT 259 213               Blood type: --/--/O POS, O POS (03/29 1700)  Rubella: Immune (09/26 0000)                                Physical Exam:             Alert and oriented X3  Abdomen: soft, non-tender, non-distended              Fundus: firm, non-tender, Ueven  Perineum: no edema  Lochia: light  Extremities: trace edema, no calf pain or tenderness    A: PPD # 2   Doing well - stable status  P: Routine post partum orders  DC home  Marlinda Mike CNM, MSN, [facility] 11/02/2016, 10:55 AM

## 2016-12-12 DIAGNOSIS — O021 Missed abortion: Secondary | ICD-10-CM | POA: Diagnosis not present

## 2016-12-12 DIAGNOSIS — Z130 Encounter for screening for diseases of the blood and blood-forming organs and certain disorders involving the immune mechanism: Secondary | ICD-10-CM | POA: Diagnosis not present

## 2016-12-12 DIAGNOSIS — O0289 Other abnormal products of conception: Secondary | ICD-10-CM | POA: Diagnosis not present

## 2017-01-03 NOTE — Addendum Note (Signed)
Addendum  created 01/03/17 1132 by Theodore Rahrig D, MD   Sign clinical note    

## 2017-01-03 NOTE — Anesthesia Postprocedure Evaluation (Signed)
Anesthesia Post Note  Patient: Cynthia LucksJasmine Mckee  Procedure(s) Performed: * No procedures listed *     Anesthesia Post Evaluation  Last Vitals: There were no vitals filed for this visit.  Last Pain: There were no vitals filed for this visit.               Shelton SilvasKevin D Cletis Clack

## 2018-01-01 ENCOUNTER — Ambulatory Visit (INDEPENDENT_AMBULATORY_CARE_PROVIDER_SITE_OTHER): Payer: 59 | Admitting: Family Medicine

## 2018-01-01 ENCOUNTER — Encounter: Payer: Self-pay | Admitting: Family Medicine

## 2018-01-01 VITALS — BP 120/86 | HR 66 | Temp 97.7°F | Ht 67.2 in | Wt 255.4 lb

## 2018-01-01 DIAGNOSIS — Z131 Encounter for screening for diabetes mellitus: Secondary | ICD-10-CM

## 2018-01-01 DIAGNOSIS — Z13220 Encounter for screening for lipoid disorders: Secondary | ICD-10-CM | POA: Diagnosis not present

## 2018-01-01 DIAGNOSIS — Z862 Personal history of diseases of the blood and blood-forming organs and certain disorders involving the immune mechanism: Secondary | ICD-10-CM | POA: Diagnosis not present

## 2018-01-01 DIAGNOSIS — Z0000 Encounter for general adult medical examination without abnormal findings: Secondary | ICD-10-CM

## 2018-01-01 LAB — CBC
HCT: 38.2 % (ref 36.0–46.0)
Hemoglobin: 12.3 g/dL (ref 12.0–15.0)
MCHC: 32.3 g/dL (ref 30.0–36.0)
MCV: 85.9 fl (ref 78.0–100.0)
Platelets: 247 10*3/uL (ref 150.0–400.0)
RBC: 4.45 Mil/uL (ref 3.87–5.11)
RDW: 13.9 % (ref 11.5–15.5)
WBC: 6.3 10*3/uL (ref 4.0–10.5)

## 2018-01-01 LAB — BASIC METABOLIC PANEL
BUN: 8 mg/dL (ref 6–23)
CO2: 29 mEq/L (ref 19–32)
CREATININE: 0.54 mg/dL (ref 0.40–1.20)
Calcium: 9.3 mg/dL (ref 8.4–10.5)
Chloride: 104 mEq/L (ref 96–112)
GFR: 169.61 mL/min (ref >60.00)
Glucose, Bld: 95 mg/dL (ref 70–99)
Potassium: 4.7 mEq/L (ref 3.5–5.1)
Sodium: 138 mEq/L (ref 135–145)

## 2018-01-01 LAB — HEMOGLOBIN A1C: Hgb A1c MFr Bld: 5.6 % (ref 4.6–6.5)

## 2018-01-01 LAB — LIPID PANEL
CHOL/HDL RATIO: 3
Cholesterol: 171 mg/dL (ref 0–200)
HDL: 49.5 mg/dL (ref >39.00)
LDL CALC: 106 mg/dL — AB (ref 0–99)
NONHDL: 121.18
TRIGLYCERIDES: 74 mg/dL (ref 0.0–149.0)
VLDL: 14.8 mg/dL (ref 0.0–40.0)

## 2018-01-01 NOTE — Patient Instructions (Signed)
Preventive Care 18-39 Years, Female Preventive care refers to lifestyle choices and visits with your health care provider that can promote health and wellness. What does preventive care include?  A yearly physical exam. This is also called an annual well check.  Dental exams once or twice a year.  Routine eye exams. Ask your health care provider how often you should have your eyes checked.  Personal lifestyle choices, including: ? Daily care of your teeth and gums. ? Regular physical activity. ? Eating a healthy diet. ? Avoiding tobacco and drug use. ? Limiting alcohol use. ? Practicing safe sex. ? Taking vitamin and mineral supplements as recommended by your health care provider. What happens during an annual well check? The services and screenings done by your health care provider during your annual well check will depend on your age, overall health, lifestyle risk factors, and family history of disease. Counseling Your health care provider may ask you questions about your:  Alcohol use.  Tobacco use.  Drug use.  Emotional well-being.  Home and relationship well-being.  Sexual activity.  Eating habits.  Work and work Statistician.  Method of birth control.  Menstrual cycle.  Pregnancy history.  Screening You may have the following tests or measurements:  Height, weight, and BMI.  Diabetes screening. This is done by checking your blood sugar (glucose) after you have not eaten for a while (fasting).  Blood pressure.  Lipid and cholesterol levels. These may be checked every 5 years starting at age 66.  Skin check.  Hepatitis C blood test.  Hepatitis B blood test.  Sexually transmitted disease (STD) testing.  BRCA-related cancer screening. This may be done if you have a family history of breast, ovarian, tubal, or peritoneal cancers.  Pelvic exam and Pap test. This may be done every 3 years starting at age 40. Starting at age 59, this may be done every 5  years if you have a Pap test in combination with an HPV test.  Discuss your test results, treatment options, and if necessary, the need for more tests with your health care provider. Vaccines Your health care provider may recommend certain vaccines, such as:  Influenza vaccine. This is recommended every year.  Tetanus, diphtheria, and acellular pertussis (Tdap, Td) vaccine. You may need a Td booster every 10 years.  Varicella vaccine. You may need this if you have not been vaccinated.  HPV vaccine. If you are 69 or younger, you may need three doses over 6 months.  Measles, mumps, and rubella (MMR) vaccine. You may need at least one dose of MMR. You may also need a second dose.  Pneumococcal 13-valent conjugate (PCV13) vaccine. You may need this if you have certain conditions and were not previously vaccinated.  Pneumococcal polysaccharide (PPSV23) vaccine. You may need one or two doses if you smoke cigarettes or if you have certain conditions.  Meningococcal vaccine. One dose is recommended if you are age 27-21 years and a first-year college student living in a residence hall, or if you have one of several medical conditions. You may also need additional booster doses.  Hepatitis A vaccine. You may need this if you have certain conditions or if you travel or work in places where you may be exposed to hepatitis A.  Hepatitis B vaccine. You may need this if you have certain conditions or if you travel or work in places where you may be exposed to hepatitis B.  Haemophilus influenzae type b (Hib) vaccine. You may need this if  you have certain risk factors.  Talk to your health care provider about which screenings and vaccines you need and how often you need them. This information is not intended to replace advice given to you by your health care provider. Make sure you discuss any questions you have with your health care provider. Document Released: 09/17/2001 Document Revised: 04/10/2016  Document Reviewed: 05/23/2015 Elsevier Interactive Patient Education  2018 [place] When you stop breastfeeding, it is called weaning. This can be a natural process that takes place on its own over time. There may be a reason you need to stop breastfeeding before it can happen naturally (such as you may need to go back to work, be away from home on a trip, or you feel it is the right time). If possible, wait until your baby is at least 44 months old. With a little time and preparation, weaning can be a positive experience. When is the best time to stop breastfeeding? There is no right or wrong answer. What is best for you and your child may be different from what is best for other mothers and their children. It is recommended to:  Feed your baby only breast milk for the first 6 months.  Feed your baby both breast milk and solid food for another 6 months.  Continue to breastfeed your baby as long as both you and your child desire after 12 months.  How do I start weaning? Wean gradually over several weeks. Some guidelines to follow are:  Start by introducing iron-fortified, solid food in addition to breast milk.  Encourage your baby to try different feeding methods.  Teach your baby to drink from a cup. Try putting pre-pumped (expressed) breast milk in the cup.  If your baby will not use a cup, try offering a bottle.  It may be easier and less confusing for your baby if someone else offers the first cup or bottle feeding.  When cup or bottle feeding is successful, and your baby is getting enough nutrition that way, you can substitute a cup feeding for a breastfeeding session.  You can eventually substitute a cup feeding for another breastfeeding session, especially if your baby will drink something besides your expressed breast milk.  Continue replacing one more breastfeeding session every few days.  Your breasts may feel full and uncomfortable at times during weaning.  Express just a small amount of milk for relief.  How does breastfeeding stop naturally? Children may start to wean themselves at about 6 months. This may happen when:  You introduce solid food. Your baby may still prefer to nurse in order to get fluids.  Your baby is better able to drink from a cup. This may prompt your baby to breastfeed less often.  Your baby gradually becomes less interested in breastfeeding as he or she gets used to drinking other fluids.  Your baby slowly starts to drop one breastfeeding session every 2-3 days.  When should I get help with weaning? Your health care provider or a lactation consultant can help you during the weaning process. They are good resources to ask which foods are best to introduce first and which fluids you can start substituting for breast milk. They can also help if you are having any problems related to weaning. When should I contact my health care provider? You should contact your health care provider if:  Your baby is not gaining weight.  You baby suddenly stops nursing.  One or both breasts become firm and  painful.  This information is not intended to replace advice given to you by your health care provider. Make sure you discuss any questions you have with your health care provider. Document Released: 11/20/2004 Document Revised: 12/28/2015 Document Reviewed: 05/18/2013 Elsevier Interactive Patient Education  [employer].

## 2018-01-01 NOTE — Progress Notes (Signed)
Subjective:     Cynthia Mckee is a 32 y.o. female and is here for a comprehensive physical exam. The patient reports no problems.  She is taking PNV.  Pt endorses h/o anemia and PCOS.  Pt is also followed by OB/Gyn.  Pt has a 69-month-old daughter named Cynthia Mckee.  Patient is trying to stop breast-feeding.  Patient has not had a normal period yet.  She endorses some spotting.  Pt has been married for almost 8 yrs.  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  She completed the joint program for social work at [employer] and Data processing manager and [employer].  Allergies: NKDA  Past surgical history: D&C  Family history: Sister-Asia, osteosarcoma at age 68 in remission x10 years.  Social History   Socioeconomic History  . Marital status: Married    Spouse name: Not on file  . Number of children: Not on file  . Years of education: Not on file  . Highest education level: Not on file  Occupational History  . Not on file  Social Needs  . Financial resource strain: Not on file  . Food insecurity:    Worry: Not on file    Inability: Not on file  . Transportation needs:    Medical: Not on file    Non-medical: Not on file  Tobacco Use  . Smoking status: Never Smoker  . Smokeless tobacco: Never Used  Substance and Sexual Activity  . Alcohol use: No  . Drug use: No  . Sexual activity: Yes    Birth control/protection: None  Lifestyle  . Physical activity:    Days per week: Not on file    Minutes per session: Not on file  . Stress: Not on file  Relationships  . Social connections:    Talks on phone: Not on file    Gets together: Not on file    Attends religious service: Not on file    Active member of club or organization: Not on file    Attends meetings of clubs or organizations: Not on file    Relationship status: Not on file  . Intimate partner violence:    Fear of current or ex partner: Not on file    Emotionally abused: Not on file   Physically abused: Not on file    Forced sexual activity: Not on file  Other Topics Concern  . Not on file  Social History Narrative  . Not on file   Health Maintenance  Topic Date Due  . HIV Screening  03/29/2002  . TETANUS/TDAP  03/29/2006  . PAP SMEAR  03/29/2008  . INFLUENZA VACCINE  03/05/2018    The following portions of the patient's history were reviewed and updated as appropriate: allergies, current medications, past family history, past medical history, past social history, past surgical history and problem list.  Review of Systems A comprehensive review of systems was negative.   Objective:    BP 120/86 (BP Location: Left Arm, Patient Position: Sitting, Cuff Size: Normal)   Pulse 66   Temp 97.7 F (36.5 C) (Oral)   Ht 5' 7.2" (1.707 m)   Wt 255 lb 6.4 oz (115.8 kg)   SpO2 98%   BMI 39.76 kg/m    General appearance: alert, cooperative, appears stated age, no distress and mildly obese Head: Normocephalic, without obvious abnormality, atraumatic Eyes: conjunctivae/corneas clear. PERRL, EOM's intact. Fundi benign. Ears: normal TM's and external ear canals both ears Nose: Nares normal. Septum midline. Mucosa  normal. No drainage or sinus tenderness. Throat: lips, mucosa, and tongue normal; teeth and gums normal Neck: no adenopathy, no carotid bruit, no JVD, supple, symmetrical, trachea midline and thyroid not enlarged, symmetric, no tenderness/mass/nodules Lungs: clear to auscultation bilaterally Heart: regular rate and rhythm, S1, S2 normal, no murmur, click, rub or gallop Abdomen: soft, non-tender; bowel sounds normal; no masses,  no organomegaly Pelvic: deferred Extremities: extremities normal, atraumatic, no cyanosis or edema Skin: Skin color, texture, turgor normal. No rashes or lesions Neurologic: Alert and oriented X 3, normal strength and tone. Normal symmetric reflexes. Normal coordination and gait    Assessment:    Healthy female exam.     Plan:       Anticipatory guidance given including wearing seatbelts, smoke detectors in the home, increasing physical activity, increasing p.o. intake of water and vegetables. -Pap up to date, followed by OB/Gyn -will obtain labs this visit: CBC, BMP, lipid panel, hgb A1C. -continue PNV -given handouts -next CPE in 1 yr See After Visit Summary for Counseling Recommendations   F/u prn    Abbe Amsterdam, MD

## 2018-03-30 DIAGNOSIS — Z3A01 Less than 8 weeks gestation of pregnancy: Secondary | ICD-10-CM | POA: Diagnosis not present

## 2018-03-30 DIAGNOSIS — O09291 Supervision of pregnancy with other poor reproductive or obstetric history, first trimester: Secondary | ICD-10-CM | POA: Diagnosis not present

## 2018-03-30 DIAGNOSIS — Z3201 Encounter for pregnancy test, result positive: Secondary | ICD-10-CM | POA: Diagnosis not present

## 2018-04-01 ENCOUNTER — Encounter: Payer: Self-pay | Admitting: Family Medicine

## 2018-04-01 ENCOUNTER — Ambulatory Visit (INDEPENDENT_AMBULATORY_CARE_PROVIDER_SITE_OTHER): Payer: 59 | Admitting: Family Medicine

## 2018-04-01 VITALS — BP 110/72 | HR 88 | Temp 98.3°F | Wt 256.0 lb

## 2018-04-01 DIAGNOSIS — Z3491 Encounter for supervision of normal pregnancy, unspecified, first trimester: Secondary | ICD-10-CM | POA: Diagnosis not present

## 2018-04-01 DIAGNOSIS — M546 Pain in thoracic spine: Secondary | ICD-10-CM | POA: Diagnosis not present

## 2018-04-01 DIAGNOSIS — R197 Diarrhea, unspecified: Secondary | ICD-10-CM | POA: Diagnosis not present

## 2018-04-01 DIAGNOSIS — O09299 Supervision of pregnancy with other poor reproductive or obstetric history, unspecified trimester: Secondary | ICD-10-CM | POA: Diagnosis not present

## 2018-04-01 DIAGNOSIS — Z3A01 Less than 8 weeks gestation of pregnancy: Secondary | ICD-10-CM | POA: Diagnosis not present
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
## 2018-04-01 NOTE — Patient Instructions (Addendum)
You can take Tylenol as needed for your back pain.  You can also put heat on your back and use massage to relieve your symptoms.   Back Pain, Adult Back pain is very common. The pain often gets better over time. The cause of back pain is usually not dangerous. Most people can learn to manage their back pain on their own. Follow these instructions at home: Watch your back pain for any changes. The following actions may help to lessen any pain you are feeling:  Stay active. Start with short walks on flat ground if you can. Try to walk farther each day.  Exercise regularly as told by your doctor. Exercise helps your back heal faster. It also helps avoid future injury by keeping your muscles strong and flexible.  Do not sit, drive, or stand in one place for more than 30 minutes.  Do not stay in bed. Resting more than 1-2 days can slow down your recovery.  Be careful when you bend or lift an object. Use good form when lifting: ? Bend at your knees. ? Keep the object close to your body. ? Do not twist.  Sleep on a firm mattress. Lie on your side, and bend your knees. If you lie on your back, put a pillow under your knees.  Take medicines only as told by your doctor.  Put ice on the injured area. ? Put ice in a plastic bag. ? Place a towel between your skin and the bag. ? Leave the ice on for 20 minutes, 2-3 times a day for the first 2-3 days. After that, you can switch between ice and heat packs.  Avoid feeling anxious or stressed. Find good ways to deal with stress, such as exercise.  Maintain a healthy weight. Extra weight puts stress on your back.  Contact a doctor if:  You have pain that does not go away with rest or medicine.  You have worsening pain that goes down into your legs or buttocks.  You have pain that does not get better in one week.  You have pain at night.  You lose weight.  You have a fever or chills. Get help right away if:  You cannot control when you  poop (bowel movement) or pee (urinate).  Your arms or legs feel weak.  Your arms or legs lose feeling (numbness).  You feel sick to your stomach (nauseous) or throw up (vomit).  You have belly (abdominal) pain.  You feel like you may pass out (faint). This information is not intended to replace advice given to you by your health care provider. Make sure you discuss any questions you have with your health care provider. Document Released: 01/08/2008 Document Revised: 12/28/2015 Document Reviewed: 11/23/2013 Elsevier Interactive Patient Education  2018 [employer].  Food Choices to Help Relieve Diarrhea, Adult When you have diarrhea, the foods you eat and your eating habits are very important. Choosing the right foods and drinks can help:  Relieve diarrhea.  Replace lost fluids and nutrients.  Prevent dehydration.  What general guidelines should I follow? Relieving diarrhea  Choose foods with less than 2 g or .07 oz. of fiber per serving.  Limit fats to less than 8 tsp (38 g or 1.34 oz.) a day.  Avoid the following: ? Foods and beverages sweetened with high-fructose corn syrup, honey, or sugar alcohols such as xylitol, sorbitol, and mannitol. ? Foods that contain a lot of fat or sugar. ? Fried, greasy, or spicy foods. ? High-fiber  grains, breads, and cereals. ? Raw fruits and vegetables.  Eat foods that are rich in probiotics. These foods include dairy products such as yogurt and fermented milk products. They help increase healthy bacteria in the stomach and intestines (gastrointestinal tract, or GI tract).  If you have lactose intolerance, avoid dairy products. These may make your diarrhea worse.  Take medicine to help stop diarrhea (antidiarrheal medicine) only as told by your health care provider. Replacing nutrients  Eat small meals or snacks every 3-4 hours.  Eat bland foods, such as white rice, toast, or baked potato, until your diarrhea starts to get better.  Gradually reintroduce nutrient-rich foods as tolerated or as told by your health care provider. This includes: ? Well-cooked protein foods. ? Peeled, seeded, and soft-cooked fruits and vegetables. ? Low-fat dairy products.  Take vitamin and mineral supplements as told by your health care provider. Preventing dehydration   Start by sipping water or a special solution to prevent dehydration (oral rehydration solution, ORS). Urine that is clear or pale yellow means that you are getting enough fluid.  Try to drink at least 8-10 cups of fluid each day to help replace lost fluids.  You may add other liquids in addition to water, such as clear juice or decaffeinated sports drinks, as tolerated or as told by your health care provider.  Avoid drinks with caffeine, such as coffee, tea, or soft drinks.  Avoid alcohol. What foods are recommended? The items listed may not be a complete list. Talk with your health care provider about what dietary choices are best for you. Grains White rice. White, [place], or pita breads (fresh or toasted), including plain rolls, buns, or bagels. White pasta. Saltine, soda, or graham crackers. Pretzels. Low-fiber cereal. Cooked cereals made with water (such as cornmeal, farina, or cream cereals). Plain muffins. Matzo. Melba toast. Zwieback. Vegetables Potatoes (without the skin). Most well-cooked and canned vegetables without skins or seeds. Tender lettuce. Fruits Apple sauce. Fruits canned in juice. Cooked apricots, cherries, grapefruit, peaches, pears, or plums. Fresh bananas and cantaloupe. Meats and other protein foods Baked or boiled chicken. Eggs. Tofu. Fish. Seafood. Smooth nut butters. Ground or well-cooked tender beef, ham, veal, lamb, pork, or poultry. Dairy Plain yogurt, kefir, and unsweetened liquid yogurt. Lactose-free milk, buttermilk, skim milk, or soy milk. Low-fat or nonfat hard cheese. Beverages Water. Low-calorie sports drinks. Fruit juices without  pulp. Strained tomato and vegetable juices. Decaffeinated teas. Sugar-free beverages not sweetened with sugar alcohols. Oral rehydration solutions, if approved by your health care provider. Seasoning and other foods Bouillon, broth, or soups made from recommended foods. What foods are not recommended? The items listed may not be a complete list. Talk with your health care provider about what dietary choices are best for you. Grains Whole grain, whole wheat, bran, or rye breads, rolls, pastas, and crackers. Wild or brown rice. Whole grain or bran cereals. Barley. Oats and oatmeal. Corn tortillas or taco shells. Granola. Popcorn. Vegetables Raw vegetables. Fried vegetables. Cabbage, broccoli, Brussels sprouts, artichokes, baked beans, beet greens, corn, kale, legumes, peas, sweet potatoes, and yams. Potato skins. Cooked spinach and cabbage. Fruits Dried fruit, including raisins and dates. Raw fruits. Stewed or dried prunes. Canned fruits with syrup. Meat and other protein foods Fried or fatty meats. Deli meats. Chunky nut butters. Nuts and seeds. Beans and lentils. Tomasa Blase. Hot dogs. Sausage. Dairy High-fat cheeses. Whole milk, chocolate milk, and beverages made with milk, such as milk shakes. Half-and-half. Cream. sour cream. Ice cream. Beverages Caffeinated  beverages (such as coffee, tea, soda, or energy drinks). Alcoholic beverages. Fruit juices with pulp. Prune juice. Soft drinks sweetened with high-fructose corn syrup or sugar alcohols. High-calorie sports drinks. Fats and oils Butter. Cream sauces. Margarine. Salad oils. Plain salad dressings. Olives. Avocados. Mayonnaise. Sweets and desserts Sweet rolls, doughnuts, and sweet breads. Sugar-free desserts sweetened with sugar alcohols such as xylitol and sorbitol. Seasoning and other foods Honey. Hot sauce. Chili powder. Gravy. Cream-based or milk-based soups. Pancakes and waffles. Summary  When you have diarrhea, the foods you eat and your  eating habits are very important.  Make sure you get at least 8-10 cups of fluid each day, or enough to keep your urine clear or pale yellow.  Eat bland foods and gradually reintroduce healthy, nutrient-rich foods as tolerated, or as told by your health care provider.  Avoid high-fiber, fried, greasy, or spicy foods. This information is not intended to replace advice given to you by your health care provider. Make sure you discuss any questions you have with your health care provider. Document Released: 10/12/2003 Document Revised: 07/19/2016 Document Reviewed: 07/19/2016 Elsevier Interactive Patient Education  2018 [employer] Probiotics What are probiotics? Probiotics are the good bacteria and yeasts that live in your body and keep you and your digestive system healthy. Probiotics also help your body's defense (immune) system and protect your body against bad bacterial growth. Certain foods contain probiotics, such as yogurt. Probiotics can also be purchased as a supplement. As with any supplement or drug, it is important to discuss its use with your health care provider. What affects the balance of bacteria in my body? The balance of bacteria in your body can be affected by:  Antibiotic medicines. Antibiotics are sometimes necessary to treat infection. Unfortunately, they may kill good or friendly bacteria in your body as well as the bad bacteria. This may lead to stomach problems like diarrhea, gas, and cramping.  Disease. Some conditions are the result of an overgrowth of bad bacteria, yeasts, parasites, or fungi. These conditions include: ? Infectious diarrhea. ? Stomach and respiratory infections. ? Skin infections. ? Irritable bowel syndrome (IBS). ? Inflammatory bowel diseases. ? Ulcer due to Helicobacter pylori (H. pylori) infection. ? Tooth decay and periodontal disease. ? Vaginal infections.  Stress and poor diet may also lower the good bacteria in your body. What type of  probiotic is right for me? Probiotics are available over the counter at your local pharmacy, health food, or grocery store. They come in many different forms, combinations of strains, and dosing strengths. Some may need to be refrigerated. Always read the label for storage and usage instructions. Specific strains have been shown to be more effective for certain conditions. Ask your health care provider what option is best for you. Why would I need probiotics? There are many reasons your health care provider might recommend a probiotic supplement, including:  Diarrhea.  Constipation.  IBS.  Respiratory infections.  Yeast infections.  Acne, eczema, and other skin conditions.  Frequent urinary tract infections (UTIs).  Are there side effects of probiotics? Some people experience mild side effects when taking probiotics. Side effects are usually temporary and may include:  Gas.  Bloating.  Cramping.  Rarely, serious side effects, such as infection or immune system changes, may occur. What else do I need to know about probiotics?  There are many different strains of probiotics. Certain strains may be more effective depending on your condition. Probiotics are available in varying doses. Ask your health care provider  which probiotic you should use and how often.  If you are taking probiotics along with antibiotics, it is generally recommended to wait at least 2 hours between taking the antibiotic and taking the probiotic. For more information: [facility] [email or web address] This information is not intended to replace advice given to you by your health care provider. Make sure you discuss any questions you have with your health care provider. Document Released: 02/16/2014 Document Revised: 06/18/2016 Document Reviewed: 10/19/2013 Elsevier Interactive Patient Education  2017 [employer].

## 2018-04-03 DIAGNOSIS — O09299 Supervision of pregnancy with other poor reproductive or obstetric history, unspecified trimester: Secondary | ICD-10-CM | POA: Diagnosis not present

## 2018-04-03 DIAGNOSIS — Z3201 Encounter for pregnancy test, result positive: Secondary | ICD-10-CM | POA: Diagnosis not present

## 2018-04-07 DIAGNOSIS — Z3A01 Less than 8 weeks gestation of pregnancy: Secondary | ICD-10-CM | POA: Diagnosis not present

## 2018-04-07 DIAGNOSIS — O0281 Inappropriate change in quantitative human chorionic gonadotropin (hCG) in early pregnancy: Secondary | ICD-10-CM | POA: Diagnosis not present

## 2018-04-14 DIAGNOSIS — O0281 Inappropriate change in quantitative human chorionic gonadotropin (hCG) in early pregnancy: Secondary | ICD-10-CM | POA: Diagnosis not present

## 2018-05-07 ENCOUNTER — Encounter: Payer: Self-pay | Admitting: Family Medicine

## 2018-05-07 ENCOUNTER — Ambulatory Visit (INDEPENDENT_AMBULATORY_CARE_PROVIDER_SITE_OTHER): Payer: 59 | Admitting: Family Medicine

## 2018-05-07 VITALS — BP 118/80 | HR 78 | Temp 98.5°F | Wt 257.0 lb

## 2018-05-07 DIAGNOSIS — M546 Pain in thoracic spine: Secondary | ICD-10-CM | POA: Diagnosis not present

## 2018-05-07 DIAGNOSIS — G8929 Other chronic pain: Secondary | ICD-10-CM

## 2018-05-07 MED ORDER — CYCLOBENZAPRINE HCL 5 MG PO TABS: 5.0000 mg | ORAL_TABLET | Freq: Three times a day (TID) | ORAL | 1 refills | Status: DC | PRN

## 2018-05-07 NOTE — Patient Instructions (Signed)
Back Exercises If you have pain in your back, do these exercises 2-3 times each day or as told by your doctor. When the pain goes away, do the exercises once each day, but repeat the steps more times for each exercise (do more repetitions). If you do not have pain in your back, do these exercises once each day or as told by your doctor. Exercises Single Knee to Chest  Do these steps 3-5 times in a row for each leg: 1. Lie on your back on a firm bed or the floor with your legs stretched out. 2. Bring one knee to your chest. 3. Hold your knee to your chest by grabbing your knee or thigh. 4. Pull on your knee until you feel a gentle stretch in your lower back. 5. Keep doing the stretch for 10-30 seconds. 6. Slowly let go of your leg and straighten it.  Pelvic Tilt  Do these steps 5-10 times in a row: 1. Lie on your back on a firm bed or the floor with your legs stretched out. 2. Bend your knees so they point up to the ceiling. Your feet should be flat on the floor. 3. Tighten your lower belly (abdomen) muscles to press your lower back against the floor. This will make your tailbone point up to the ceiling instead of pointing down to your feet or the floor. 4. Stay in this position for 5-10 seconds while you gently tighten your muscles and breathe evenly.  Cat-Cow  Do these steps until your lower back bends more easily: 1. Get on your hands and knees on a firm surface. Keep your hands under your shoulders, and keep your knees under your hips. You may put padding under your knees. 2. Let your head hang down, and make your tailbone point down to the floor so your lower back is round like the back of a cat. 3. Stay in this position for 5 seconds. 4. Slowly lift your head and make your tailbone point up to the ceiling so your back hangs low (sags) like the back of a cow. 5. Stay in this position for 5 seconds.  Press-Ups  Do these steps 5-10 times in a row: 1. Lie on your belly (face-down)  on the floor. 2. Place your hands near your head, about shoulder-width apart. 3. While you keep your back relaxed and keep your hips on the floor, slowly straighten your arms to raise the top half of your body and lift your shoulders. Do not use your back muscles. To make yourself more comfortable, you may change where you place your hands. 4. Stay in this position for 5 seconds. 5. Slowly return to lying flat on the floor.  Bridges  Do these steps 10 times in a row: 1. Lie on your back on a firm surface. 2. Bend your knees so they point up to the ceiling. Your feet should be flat on the floor. 3. Tighten your butt muscles and lift your butt off of the floor until your waist is almost as high as your knees. If you do not feel the muscles working in your butt and the back of your thighs, slide your feet 1-2 inches farther away from your butt. 4. Stay in this position for 3-5 seconds. 5. Slowly lower your butt to the floor, and let your butt muscles relax.  If this exercise is too easy, try doing it with your arms crossed over your chest. Belly Crunches  Do these steps 5-10 times in   a row: 1. Lie on your back on a firm bed or the floor with your legs stretched out. 2. Bend your knees so they point up to the ceiling. Your feet should be flat on the floor. 3. Cross your arms over your chest. 4. Tip your chin a little bit toward your chest but do not bend your neck. 5. Tighten your belly muscles and slowly raise your chest just enough to lift your shoulder blades a tiny bit off of the floor. 6. Slowly lower your chest and your head to the floor.  Back Lifts Do these steps 5-10 times in a row: 1. Lie on your belly (face-down) with your arms at your sides, and rest your forehead on the floor. 2. Tighten the muscles in your legs and your butt. 3. Slowly lift your chest off of the floor while you keep your hips on the floor. Keep the back of your head in line with the curve in your back. Look at  the floor while you do this. 4. Stay in this position for 3-5 seconds. 5. Slowly lower your chest and your face to the floor.  Contact a doctor if:  Your back pain gets a lot worse when you do an exercise.  Your back pain does not lessen 2 hours after you exercise. If you have any of these problems, stop doing the exercises. Do not do them again unless your doctor says it is okay. Get help right away if:  You have sudden, very bad back pain. If this happens, stop doing the exercises. Do not do them again unless your doctor says it is okay. This information is not intended to replace advice given to you by your health care provider. Make sure you discuss any questions you have with your health care provider. Document Released: 08/24/2010 Document Revised: 12/28/2015 Document Reviewed: 09/15/2014 Elsevier Interactive Patient Education  2018 [employer] Inc.  Chronic Back Pain When back pain lasts longer than 3 months, it is called chronic back pain.The cause of your back pain may not be known. Some common causes include:  Wear and tear (degenerative disease) of the bones, ligaments, or disks in your back.  Inflammation and stiffness in your back (arthritis).  People who have chronic back pain often go through certain periods in which the pain is more intense (flare-ups). Many people can learn to manage the pain with home care. Follow these instructions at home: Pay attention to any changes in your symptoms. Take these actions to help with your pain: Activity  Avoid bending and activities that make the problem worse.  Do not sit or stand in one place for long periods of time.  Take brief periods of rest throughout the day. This will reduce your pain. Resting in a lying or standing position is usually better than sitting to rest.  When you are resting for longer periods, mix in some mild activity or stretching between periods of rest. This will help to prevent stiffness and pain.  Get  regular exercise. Ask your health care provider what activities are safe for you.  Do not lift anything that is heavier than 10 lb (4.5 kg). Always use proper lifting technique, which includes: ? Bending your knees. ? Keeping the load close to your body. ? Avoiding twisting. Managing pain  If directed, apply ice to the painful area. Your health care provider may recommend applying ice during the first 24-48 hours after a flare-up begins. ? Put ice in a plastic bag. ? Place   a towel between your skin and the bag. ? Leave the ice on for 20 minutes, 2-3 times per day.  After icing, apply heat to the affected area as often as told by your health care provider. Use the heat source that your health care provider recommends, such as a moist heat pack or a heating pad. ? Place a towel between your skin and the heat source. ? Leave the heat on for 20-30 minutes. ? Remove the heat if your skin turns bright red. This is especially important if you are unable to feel pain, heat, or cold. You may have a greater risk of getting burned.  Try soaking in a warm tub.  Take over-the-counter and prescription medicines only as told by your health care provider.  Keep all follow-up visits as told by your health care provider. This is important. Contact a health care provider if:  You have pain that is not relieved with rest or medicine. Get help right away if:  You have weakness or numbness in one or both of your legs or feet.  You have trouble controlling your bladder or your bowels.  You have nausea or vomiting.  You have pain in your abdomen.  You have shortness of breath or you faint. This information is not intended to replace advice given to you by your health care provider. Make sure you discuss any questions you have with your health care provider. Document Released: 08/29/2004 Document Revised: 11/30/2015 Document Reviewed: 01/09/2015 Elsevier Interactive Patient Education  2018 Elsevier  Inc[employer]  
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
## 2018-05-27 ENCOUNTER — Other Ambulatory Visit: Payer: Self-pay

## 2018-05-27 ENCOUNTER — Ambulatory Visit: Payer: 59 | Attending: Family Medicine | Admitting: Physical Therapy

## 2018-05-27 DIAGNOSIS — M546 Pain in thoracic spine: Secondary | ICD-10-CM | POA: Insufficient documentation

## 2018-05-27 DIAGNOSIS — M62830 Muscle spasm of back: Secondary | ICD-10-CM | POA: Insufficient documentation

## 2018-05-27 DIAGNOSIS — M6281 Muscle weakness (generalized): Secondary | ICD-10-CM | POA: Insufficient documentation

## 2018-05-27 NOTE — Patient Instructions (Addendum)
Trigger Point Dry Needling  . What is Trigger Point Dry Needling (DN)? o DN is a physical therapy technique used to treat muscle pain and dysfunction. Specifically, DN helps deactivate muscle trigger points (muscle knots).  o A thin filiform needle is used to penetrate the skin and stimulate the underlying trigger point. The goal is for a local twitch response (LTR) to occur and for the trigger point to relax. No medication of any kind is injected during the procedure.   . What Does Trigger Point Dry Needling Feel Like?  o The procedure feels different for each individual patient. Some patients report that they do not actually feel the needle enter the skin and overall the process is not painful. Very mild bleeding may occur. However, many patients feel a deep cramping in the muscle in which the needle was inserted. This is the local twitch response.   Marland Kitchen How Will I feel after the treatment? o Soreness is normal, and the onset of soreness may not occur for a few hours. Typically this soreness does not last longer than two days.  o Bruising is uncommon, however; ice can be used to decrease any possible bruising.  o In rare cases feeling tired or nauseous after the treatment is normal. In addition, your symptoms may get worse before they get better, this period will typically not last longer than 24 hours.   . What Can I do After My Treatment? o Increase your hydration by drinking more water for the next 24 hours. o You may place ice or heat on the areas treated that have become sore, however, do not use heat on inflamed or bruised areas. Heat often brings more relief post needling. o You can continue your regular activities, but vigorous activity is not recommended initially after the treatment for 24 hours. o DN is best combined with other physical therapy such as strengthening, stretching, and other therapies.    [facility] [address] [place], [place]  [address] Phone # [phone] Fax [phone]  Access Code: [facility]  URL: [email or web address]  Date: 05/27/2018  Prepared by: Dorie Rank   Exercises  Sidelying Thoracic Rotation with Open Book - 5 reps - 1 sets - 10 hold - 1x daily - 7x weekly  Seated Thoracic Flexion and Rotation with Arms Crossed - 10 reps - 3 sets - 1x daily - 7x weekly  Patient Education  Office Posture
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
## 2018-06-01 ENCOUNTER — Encounter: Payer: Self-pay | Admitting: Physical Therapy

## 2018-06-01 ENCOUNTER — Ambulatory Visit: Payer: 59 | Admitting: Physical Therapy

## 2018-06-01 DIAGNOSIS — M546 Pain in thoracic spine: Secondary | ICD-10-CM | POA: Diagnosis not present

## 2018-06-01 DIAGNOSIS — M62830 Muscle spasm of back: Secondary | ICD-10-CM | POA: Diagnosis not present

## 2018-06-01 DIAGNOSIS — M6281 Muscle weakness (generalized): Secondary | ICD-10-CM | POA: Diagnosis not present

## 2018-06-01 NOTE — Therapy (Signed)
[facility] [address], [address] [place], [place], [address] Phone: [phone]   Fax:  [phone]  Physical Therapy Treatment  Patient Details  Name: Cynthia Mckee MRN: [id number] Date of Birth: 22-Oct-1985 Referring Provider (PT): Deeann Saint   Encounter Date: 06/01/2018  PT End of Session - 06/01/18 0804    Visit Number  2    Date for PT Re-Evaluation  07/08/18    PT Start Time  0804    PT Stop Time  0842    PT Time Calculation (min)  38 min    Activity Tolerance  Patient tolerated treatment well    Behavior During Therapy  [facility] for tasks assessed/performed       Past Medical History:  Diagnosis Date  . Anemia    one year ago, last check was normal    Past Surgical History:  Procedure Laterality Date  . DILATATION & CURRETTAGE/HYSTEROSCOPY WITH RESECTOCOPE N/A 03/16/2015   Procedure: DILATATION & CURETTAGE/HYSTEROSCOPY WITH RESECTOCOPE;  Surgeon: Maxie Better, MD;  Location: [facility];  Service: Gynecology;  Laterality: N/A;  . miscarriage    . WISDOM TOOTH EXTRACTION  2011    There were no vitals filed for this visit.  Subjective Assessment - 06/01/18 0805    Subjective  Sore from putting clothes up over the weekend. Did stretches over the weekend, not over the week.     Currently in Pain?  Yes    Pain Score  6     Pain Location  Back    Pain Orientation  Lower    Pain Descriptors / Indicators  Sore    Multiple Pain Sites  No                       [facility] Adult PT Treatment/Exercise - 06/01/18 0001      Self-Care   Self-Care  --   what the core is, how to contract the lower abs and when     Lumbar Exercises: Stretches   Other Lumbar Stretch Exercise  Sidelying open book 10 x Bil    Other Lumbar Stretch Exercise  Seated thoracic with flexion combo 5x each side.       Lumbar Exercises: Supine   Other Supine Lumbar Exercises  Supine on soft foam roll x 2 min:        Shoulder Exercises: Supine   Horizontal ABduction  Strengthening;Both;10 reps;Theraband   VC for core contraction throughout   Theraband Level (Shoulder Horizontal ABduction)  Level 1 (Yellow)   on foam roll   Other Supine Exercises  snow angel arms on the foam roll 5x      Shoulder Exercises: Seated   Other Seated Exercises  3 way shoulder raise 1# 10x each direction      Shoulder Exercises: Pulleys   Flexion  3 minutes   PTA present to discuss current status and discuss core.   ABduction  3 minutes                  PT Long Term Goals - 05/27/18 0844      PT LONG TERM GOAL #1   Title  pt will be ind with advanced HEP    Time  6    Period  Weeks    Status  New    Target Date  07/08/18      PT LONG TERM GOAL #2   Title  FOTO < or = to 28%  limited    Time  6    Period  Weeks    Status  New    Target Date  07/08/18      PT LONG TERM GOAL #3   Title  Pt will report 60% less pain when performing job related activities    Time  6    Period  Weeks    Status  New    Target Date  07/08/18      PT LONG TERM GOAL #4   Title  pt will demonstrate good lifting technique in order to lift her 40 month old child without increased pain    Time  6    Period  Weeks    Status  New    Target Date  07/08/18      PT LONG TERM GOAL #5   Title  UE MMT bilateral flexion and abduction at least 4+/5 for improved ability to perform household chores    Baseline  4-/5 bilateraly    Time  6    Period  Weeks    Status  New    Target Date  07/08/18            Plan - 06/01/18 0804    Clinical Impression Statement  Pt presents with no pain today just soreness she reports from putting winter clothing away. She has done her initial HEP only over the weekend and not during the week. PTA encouraged her to do everyday, even if she has to do less reps but more often.  She agreed to try. Minor verbal cuing on technique as she performed her HEP in clinic today ( core contraction). We  also began gentle strength to her shoulders ( per goals) and posture muscles/thoracic on the foam roll. Pt found laying length wise on the roll released alot of tension in her back. She is lloking forward to trying the dry needling next.      Rehab Potential  Excellent    PT Frequency  2x / week    PT Duration  6 weeks    PT Treatment/Interventions  ADLs/Self Care Home Management;Biofeedback;Cryotherapy;Electrical Stimulation;Iontophoresis 4mg /ml Dexamethasone;Moist Heat;Traction;Therapeutic activities;Therapeutic exercise;Neuromuscular re-education;Patient/family education;Manual techniques;Passive range of motion;Dry needling;Taping    PT Next Visit Plan  STM/DN, posture, core strength: give band for HEP if she tolerated it ok.    PT Home Exercise Plan  Access Code: [email or web address]     Consulted and Agree with Plan of Care  Patient       Patient will benefit from skilled therapeutic intervention in order to improve the following deficits and impairments:  Pain, Postural dysfunction, Increased fascial restricitons, Decreased strength, Decreased range of motion, Increased muscle spasms  Visit Diagnosis: Pain in thoracic spine  Muscle weakness (generalized)  Muscle spasm of back     Problem List Patient Active Problem List   Diagnosis Date Noted  . History of anemia 01/01/2018  . Postpartum care following vaginal delivery 3/29 11/01/2016    COCHRAN,JENNIFER, PTA 06/01/2018, 8:41 AM  [facility] [address], [address] [place], [place], [address] Phone: [phone]   Fax:  [phone]  Name: Orville Widmann MRN: [id number] Date of Birth: 06-05-1986

## 2018-06-04 ENCOUNTER — Ambulatory Visit: Payer: 59

## 2018-06-04 DIAGNOSIS — M546 Pain in thoracic spine: Secondary | ICD-10-CM | POA: Diagnosis not present

## 2018-06-04 DIAGNOSIS — M62830 Muscle spasm of back: Secondary | ICD-10-CM

## 2018-06-04 DIAGNOSIS — M6281 Muscle weakness (generalized): Secondary | ICD-10-CM

## 2018-06-04 NOTE — Therapy (Signed)
[facility] [address], [address] [place], [place], [address] Phone: [phone]   Fax:  [phone]  Physical Therapy Treatment  Patient Details  Name: Tanisia Mckee MRN: [id number] Date of Birth: September 16, 1985 Referring Provider (PT): Deeann Saint   Encounter Date: 06/04/2018  PT End of Session - 06/04/18 0803    Visit Number  3    Date for PT Re-Evaluation  07/08/18    PT Start Time  0731    PT Stop Time  0812    PT Time Calculation (min)  41 min    Activity Tolerance  Patient tolerated treatment well    Behavior During Therapy  [facility] for tasks assessed/performed       Past Medical History:  Diagnosis Date  . Anemia    one year ago, last check was normal    Past Surgical History:  Procedure Laterality Date  . DILATATION & CURRETTAGE/HYSTEROSCOPY WITH RESECTOCOPE N/A 03/16/2015   Procedure: DILATATION & CURETTAGE/HYSTEROSCOPY WITH RESECTOCOPE;  Surgeon: Maxie Better, MD;  Location: [facility];  Service: Gynecology;  Laterality: N/A;  . miscarriage    . WISDOM TOOTH EXTRACTION  2011    There were no vitals filed for this visit.  Subjective Assessment - 06/04/18 0735    Subjective  Ready to try dry needling.  I feel very stiff.      Currently in Pain?  Yes    Pain Score  0-No pain                       [facility] Adult PT Treatment/Exercise - 06/04/18 0001      Modalities   Modalities  Moist Heat      Moist Heat Therapy   Number Minutes Moist Heat  10 Minutes    Moist Heat Location  Other (comment)   thoracic spine     Manual Therapy   Manual Therapy  Joint mobilization;Soft tissue mobilization    Manual therapy comments  soft tissue elongation to bil thoracic paraspinals    Joint Mobilization  PA mobs T3-9 grade 2-3       Trigger Point Dry Needling - 06/04/18 0738    Consent Given?  Yes    Education Handout Provided  Yes   1st session   Muscles Treated Upper Body  --    bil thoracic multifidi               PT Long Term Goals - 05/27/18 0844      PT LONG TERM GOAL #1   Title  pt will be ind with advanced HEP    Time  6    Period  Weeks    Status  New    Target Date  07/08/18      PT LONG TERM GOAL #2   Title  FOTO < or = to 28% limited    Time  6    Period  Weeks    Status  New    Target Date  07/08/18      PT LONG TERM GOAL #3   Title  Pt will report 60% less pain when performing job related activities    Time  6    Period  Weeks    Status  New    Target Date  07/08/18      PT LONG TERM GOAL #4   Title  pt will demonstrate good lifting technique in order to lift her 74 month old  child without increased pain    Time  6    Period  Weeks    Status  New    Target Date  07/08/18      PT LONG TERM GOAL #5   Title  UE MMT bilateral flexion and abduction at least 4+/5 for improved ability to perform household chores    Baseline  4-/5 bilateraly    Time  6    Period  Weeks    Status  New    Target Date  07/08/18            Plan - 06/04/18 0740    Clinical Impression Statement  Session focused on dry needling, manual therapy for soft tissue mobility and segmental mobility.  Pt with tension and trigger points over bil thoracic paraspinals and reduced PA mobility and this improved after dry needling today.  Pt is independent and compliant in HEP.  Pt will continue to benefit from skilled PT for manual, core and postural strength and thoracic mobility.    (Pended)     Rehab Potential  Excellent  (Pended)     PT Frequency  2x / week  (Pended)     PT Duration  6 weeks  (Pended)     PT Treatment/Interventions  ADLs/Self Care Home Management;Biofeedback;Cryotherapy;Electrical Stimulation;Iontophoresis 4mg /ml Dexamethasone;Moist Heat;Traction;Therapeutic activities;Therapeutic exercise;Neuromuscular re-education;Patient/family education;Manual techniques;Passive range of motion;Dry needling;Taping  (Pended)     PT Next Visit Plan   assess response to dry needling, issue band for HEP, core strength, quadruped thoracic mobility  (Pended)     PT Home Exercise Plan  Access Code: [email or web address]   (Pended)     Consulted and Agree with Plan of Care  Patient  (Pended)        Patient will benefit from skilled therapeutic intervention in order to improve the following deficits and impairments:  (P) Pain, Postural dysfunction, Increased fascial restricitons, Decreased strength, Decreased range of motion, Increased muscle spasms  Visit Diagnosis: Pain in thoracic spine  Muscle weakness (generalized)  Muscle spasm of back     Problem List Patient Active Problem List   Diagnosis Date Noted  . History of anemia 01/01/2018  . Postpartum care following vaginal delivery 3/29 11/01/2016    Lorrene Reid, PT 06/04/18 8:04 AM  [facility] [address] [place], [place], [address] Phone: [phone]   Fax:  [phone]  Name: Cynthia Mckee MRN: [id number] Date of Birth: August 03, 1986

## 2018-06-10 ENCOUNTER — Ambulatory Visit: Payer: 59 | Attending: Family Medicine

## 2018-06-10 DIAGNOSIS — M6281 Muscle weakness (generalized): Secondary | ICD-10-CM | POA: Diagnosis not present

## 2018-06-10 DIAGNOSIS — M546 Pain in thoracic spine: Secondary | ICD-10-CM | POA: Insufficient documentation

## 2018-06-10 DIAGNOSIS — M62830 Muscle spasm of back: Secondary | ICD-10-CM | POA: Insufficient documentation

## 2018-06-10 NOTE — Patient Instructions (Signed)
Access Code: [email or web address]  URL: [email or web address]  Date: 06/10/2018  Prepared by: Lorrene Reid   Exercises  Sidelying Thoracic Rotation with Open Book - 5 reps - 1 sets - 10 hold - 1x daily - 7x weekly  Seated Thoracic Flexion and Rotation with Arms Crossed - 10 reps - 3 sets - 1x daily - 7x weekly  Supine Shoulder Horizontal Abduction with Resistance - 10 reps - 2 sets - 2x daily - 7x weekly  Supine Bilateral Shoulder External Rotation with Resistance - 10 reps - 2 sets - 2x daily - 7x weekly  Supine PNF D2 Flexion with Resistance - 10 reps - 2 sets - 2x daily - 7x weekly  Patient Education  Office Posture

## 2018-06-10 NOTE — Therapy (Signed)
[facility] [address], [address] [place], [place], [address] Phone: [phone]   Fax:  [phone]  Physical Therapy Treatment  Patient Details  Name: Cynthia Mckee MRN: [id number] Date of Birth: [birth year] Referring Provider (PT): Deeann Saint   Encounter Date: 06/10/2018  PT End of Session - 06/10/18 1616    Visit Number  4    Date for PT Re-Evaluation  07/08/18    PT Start Time  1532    PT Stop Time  1626    PT Time Calculation (min)  54 min    Activity Tolerance  Patient tolerated treatment well    Behavior During Therapy  [facility] for tasks assessed/performed       Past Medical History:  Diagnosis Date  . Anemia    one year ago, last check was normal    Past Surgical History:  Procedure Laterality Date  . DILATATION & CURRETTAGE/HYSTEROSCOPY WITH RESECTOCOPE N/A 03/16/2015   Procedure: DILATATION & CURETTAGE/HYSTEROSCOPY WITH RESECTOCOPE;  Surgeon: Maxie Better, MD;  Location: [facility];  Service: Gynecology;  Laterality: N/A;  . miscarriage    . WISDOM TOOTH EXTRACTION  2011    There were no vitals filed for this visit.  Subjective Assessment - 06/10/18 1533    Subjective  I felt a little bit looser after dry needling.      Currently in Pain?  Yes    Pain Score  5     Pain Location  Back    Pain Orientation  Right;Left;Mid    Pain Descriptors / Indicators  Sore;Tightness    Pain Type  Acute pain    Pain Onset  More than a month ago    Pain Frequency  Intermittent    Aggravating Factors   being active, driving    Pain Relieving Factors  relaxing, leaning against something                       [facility] Adult PT Treatment/Exercise - 06/10/18 0001      Exercises   Exercises  Shoulder;Neck      Shoulder Exercises: Supine   Horizontal ABduction  Strengthening;Both;10 reps;Theraband   VC for core contraction throughout   Theraband Level (Shoulder Horizontal ABduction)   Level 1 (Yellow)   on foam roll   External Rotation  Strengthening;Both;20 reps   on foam roll   Theraband Level (Shoulder External Rotation)  Level 1 (Yellow)    Flexion  Strengthening;Both;20 reps   on foam roll   Theraband Level (Shoulder Flexion)  Level 1 (Yellow)    Other Supine Exercises  snow angel arms on the foam roll 5x      Modalities   Modalities  Moist Heat      Moist Heat Therapy   Number Minutes Moist Heat  10 Minutes    Moist Heat Location  Other (comment)   thoracic spine     Manual Therapy   Manual Therapy  Joint mobilization;Soft tissue mobilization    Manual therapy comments  soft tissue elongation to bil thoracic paraspinals    Joint Mobilization  PA mobs T3-9 grade 2-3       Trigger Point Dry Needling - 06/10/18 1543    Consent Given?  Yes    Education Handout Provided  Yes    Muscles Treated Upper Body  --   bil thoracic multifidi          PT Education - 06/10/18 1556  Education Details   Access Code: [email or web address]   (Pended)     Person(s) Educated  Patient  (Pended)     Methods  Explanation;Demonstration;Handout  (Pended)     Comprehension  Verbalized understanding;Returned demonstration  Market researcher)           PT Long Term Goals - 05/27/18 0844      PT LONG TERM GOAL #1   Title  pt will be ind with advanced HEP    Time  6    Period  Weeks    Status  New    Target Date  07/08/18      PT LONG TERM GOAL #2   Title  FOTO < or = to 28% limited    Time  6    Period  Weeks    Status  New    Target Date  07/08/18      PT LONG TERM GOAL #3   Title  Pt will report 60% less pain when performing job related activities    Time  6    Period  Weeks    Status  New    Target Date  07/08/18      PT LONG TERM GOAL #4   Title  pt will demonstrate good lifting technique in order to lift her 45 month old child without increased pain    Time  6    Period  Weeks    Status  New    Target Date  07/08/18      PT LONG TERM GOAL #5   Title  UE MMT  bilateral flexion and abduction at least 4+/5 for improved ability to perform household chores    Baseline  4-/5 bilateraly    Time  6    Period  Weeks    Status  New    Target Date  07/08/18            Plan - 06/10/18 1543    Clinical Impression Statement  Pt reports some relief of stiffness in the thoracic spine after dry needling last session.  PT advanced HEP for supine theraband using foam roll (she has at home now).  Pt with tension and trigger points in the thoracic spine and demonstrated improved spinal mobility and tissue after dry needling today.  Pt will continue to benefit from skilled PT for flexibility, strength and manual therapy to reduce pain.      Rehab Potential  Excellent    PT Frequency  2x / week    PT Duration  6 weeks    PT Treatment/Interventions  ADLs/Self Care Home Management;Biofeedback;Cryotherapy;Electrical Stimulation;Iontophoresis 4mg /ml Dexamethasone;Moist Heat;Traction;Therapeutic activities;Therapeutic exercise;Neuromuscular re-education;Patient/family education;Manual techniques;Passive range of motion;Dry needling;Taping    PT Next Visit Plan  assess response to dry needling, review theraband exercises    PT Home Exercise Plan  Access Code: [email or web address]     Recommended Other Services  initial certification is signed       Patient will benefit from skilled therapeutic intervention in order to improve the following deficits and impairments:  Pain, Postural dysfunction, Increased fascial restricitons, Decreased strength, Decreased range of motion, Increased muscle spasms  Visit Diagnosis: Pain in thoracic spine  Muscle weakness (generalized)  Muscle spasm of back     Problem List Patient Active Problem List   Diagnosis Date Noted  . History of anemia 01/01/2018  . Postpartum care following vaginal delivery 3/29 11/01/2016     Lorrene Reid, PT 06/10/18 4:17 PM  [facility] [address], [address] [place], [place], [address] Phone: [phone]   Fax:  [phone]  Name: Cynthia Mckee MRN: [id number] Date of Birth: 02/20/1986

## 2018-06-12 ENCOUNTER — Ambulatory Visit: Payer: 59 | Admitting: Physical Therapy

## 2018-06-15 ENCOUNTER — Ambulatory Visit: Payer: 59

## 2018-06-15 DIAGNOSIS — M546 Pain in thoracic spine: Secondary | ICD-10-CM

## 2018-06-15 DIAGNOSIS — M6281 Muscle weakness (generalized): Secondary | ICD-10-CM | POA: Diagnosis not present

## 2018-06-15 DIAGNOSIS — M62830 Muscle spasm of back: Secondary | ICD-10-CM

## 2018-06-15 NOTE — Therapy (Signed)
[facility] [address], [address] [place], [place], [address] Phone: [phone]   Fax:  [phone]  Physical Therapy Treatment  Patient Details  Name: Cynthia Mckee MRN: [id number] Date of Birth: 08-16-1985 Referring Provider (PT): Deeann Saint   Encounter Date: 06/15/2018  PT End of Session - 06/15/18 1615    Visit Number  5    Date for PT Re-Evaluation  07/08/18    PT Start Time  1531    PT Stop Time  1615    PT Time Calculation (min)  44 min    Activity Tolerance  Patient tolerated treatment well    Behavior During Therapy  [facility] for tasks assessed/performed       Past Medical History:  Diagnosis Date  . Anemia    one year ago, last check was normal    Past Surgical History:  Procedure Laterality Date  . DILATATION & CURRETTAGE/HYSTEROSCOPY WITH RESECTOCOPE N/A 03/16/2015   Procedure: DILATATION & CURETTAGE/HYSTEROSCOPY WITH RESECTOCOPE;  Surgeon: Maxie Better, MD;  Location: [facility];  Service: Gynecology;  Laterality: N/A;  . miscarriage    . WISDOM TOOTH EXTRACTION  2011    There were no vitals filed for this visit.  Subjective Assessment - 06/15/18 1533    Subjective  I was feeling good and then this morning I woke up with soreness in mid back and low back.      Patient Stated Goals  be able to lift and do things and get back to run    Currently in Pain?  Yes    Pain Score  5     Pain Orientation  Right;Left;Mid;Lower    Pain Descriptors / Indicators  Sore;Aching    Pain Type  Acute pain    Pain Onset  More than a month ago    Pain Frequency  Intermittent    Aggravating Factors   driving, use of arms    Pain Relieving Factors  lumbar support, stretch with foam roll                       [facility] Adult PT Treatment/Exercise - 06/15/18 0001      Exercises   Exercises  Shoulder;Neck;Lumbar      Lumbar Exercises: Quadruped   Madcat/Old Horse  5 reps      Shoulder  Exercises: Supine   Horizontal ABduction  Strengthening;Both;10 reps;Theraband   VC for core contraction throughout   Theraband Level (Shoulder Horizontal ABduction)  Level 2 (Red)    External Rotation  Strengthening;Both;20 reps   on foam roll   Theraband Level (Shoulder External Rotation)  Level 2 (Red)    Flexion  Strengthening;Both;20 reps   on foam roll   Theraband Level (Shoulder Flexion)  Level 2 (Red)    Other Supine Exercises  snow angel arms on the foam roll 5x      Shoulder Exercises: Seated   Flexion  Strengthening;Both;20 reps;Weights    Flexion Weight (lbs)  2    Abduction  Strengthening;Both;20 reps    ABduction Weight (lbs)  2    Diagonals  Strengthening;Both;20 reps;Weights    Diagonals Weight (lbs)  2      Manual Therapy   Manual Therapy  Joint mobilization    Joint Mobilization  PA mobs T3-9 grade 2-3             PT Education - 06/15/18 1551    Education Details  body mechanics education  Person(s) Educated  Patient    Methods  Explanation;Demonstration;Handout    Comprehension  Verbalized understanding          PT Long Term Goals - 06/15/18 1540      PT LONG TERM GOAL #3   Title  Pt will report 60% less pain when performing job related activities    Baseline  40% limitation    Time  6    Period  Weeks    Status  On-going      PT LONG TERM GOAL #4   Title  pt will demonstrate good lifting technique in order to lift her 88 month old child without increased pain    Baseline  improved technique, still with pain    Time  6    Period  Weeks    Status  On-going              Patient will benefit from skilled therapeutic intervention in order to improve the following deficits and impairments:     Visit Diagnosis: Pain in thoracic spine  Muscle weakness (generalized)  Muscle spasm of back     Problem List Patient Active Problem List   Diagnosis Date Noted  . History of anemia 01/01/2018  . Postpartum care following  vaginal delivery 3/29 11/01/2016   Lorrene Reid, PT 06/15/18 4:18 PM  [facility] [address], [address] [place], [place], [address] Phone: [phone]   Fax:  [phone]  Name: Cynthia Mckee MRN: [id number] Date of Birth: Sep 28, 1985

## 2018-06-15 NOTE — Patient Instructions (Addendum)
   Lifting Principles  Maintain proper posture and head alignment. Slide object as close as possible before lifting. Move obstacles out of the way. Test before lifting; ask for help if too heavy. Tighten stomach muscles without holding breath. Use smooth movements; do not jerk. Use legs to do the work, and pivot with feet. Distribute the work load symmetrically and close to the center of trunk. Push instead of pull whenever possible.   Squat down and hold basket close to stand. Use leg muscles to do the work.    Avoid twisting or bending back. Pivot around using foot movements, and bend at knees if needed when reaching for articles.        Getting Into / Out of Bed   Lower self to lie down on one side by raising legs and lowering head at the same time. Use arms to assist moving without twisting. Bend both knees to roll onto back if desired. To sit up, start from lying on side, and use same move-ments in reverse. Keep trunk aligned with legs.    Shift weight from front foot to back foot as item is lifted off shelf.    When leaning forward to pick object up from floor, extend one leg out behind. Keep back straight. Hold onto a sturdy support with other hand.      Sit upright, head facing forward. Try using a roll to support lower back. Keep shoulders relaxed, and avoid rounded back. Keep hips level with knees. Avoid crossing legs for long periods.     [facility] [address], [address] [place], [place] [address] Phone # [phone] Fax [phone]  

## 2018-06-17 ENCOUNTER — Encounter: Payer: Self-pay | Admitting: Physical Therapy

## 2018-06-17 ENCOUNTER — Ambulatory Visit: Payer: 59 | Admitting: Physical Therapy

## 2018-06-17 DIAGNOSIS — M546 Pain in thoracic spine: Secondary | ICD-10-CM

## 2018-06-17 DIAGNOSIS — M62830 Muscle spasm of back: Secondary | ICD-10-CM | POA: Diagnosis not present

## 2018-06-17 DIAGNOSIS — M6281 Muscle weakness (generalized): Secondary | ICD-10-CM | POA: Diagnosis not present

## 2018-06-17 NOTE — Therapy (Signed)
[facility] [address], [address] [place], [place], [address] Phone: [phone]   Fax:  [phone]  Physical Therapy Treatment  Patient Details  Name: Cynthia Mckee MRN: [id number] Date of Birth: 1986-01-12 Referring Provider (PT): Deeann SaintBanks, Shannon R   Encounter Date: 06/17/2018  PT End of Session - 06/17/18 1615    Visit Number  6    Date for PT Re-Evaluation  07/08/18    PT Start Time  1615    PT Stop Time  1650    PT Time Calculation (min)  35 min    Activity Tolerance  Patient tolerated treatment well    Behavior During Therapy  [facility] for tasks assessed/performed       Past Medical History:  Diagnosis Date  . Anemia    one year ago, last check was normal    Past Surgical History:  Procedure Laterality Date  . DILATATION & CURRETTAGE/HYSTEROSCOPY WITH RESECTOCOPE N/A 03/16/2015   Procedure: DILATATION & CURETTAGE/HYSTEROSCOPY WITH RESECTOCOPE;  Surgeon: Maxie BetterSheronette Cousins, MD;  Location: [facility];  Service: Gynecology;  Laterality: N/A;  . miscarriage    . WISDOM TOOTH EXTRACTION  2011    There were no vitals filed for this visit.  Subjective Assessment - 06/17/18 1616    Subjective  Doing good, a little muscular tension but not bad.     Currently in Pain?  No/denies    Multiple Pain Sites  No                       [facility] Adult PT Treatment/Exercise - 06/17/18 0001      Lumbar Exercises: Stretches   Other Lumbar Stretch Exercise  prayer stretch 20 sec       Lumbar Exercises: Aerobic   Nustep  L2 x 10 min   PTA present to monitor     Lumbar Exercises: Quadruped   Madcat/Old Horse  10 reps   alter leg ext 5x bil:TC for alignment and initial instructio     Knee/Hip Exercises: Machines for Strengthening   Cybex Leg Press  Seat 6 bil 50# 20x      Shoulder Exercises: Supine   Horizontal ABduction  Strengthening;Both;15 reps;Theraband   2 sets   Theraband Level (Shoulder  Horizontal ABduction)  Level 2 (Red)    External Rotation  Strengthening;Both;20 reps   on foam roll   Theraband Level (Shoulder External Rotation)  Level 2 (Red)    Flexion  Strengthening;Both;15 reps;Theraband   Diagonals   Theraband Level (Shoulder Flexion)  Level 2 (Red)    Other Supine Exercises  snow angel arms on the foam roll 8x      Shoulder Exercises: Seated   Flexion  Strengthening;Both;20 reps;Weights    Flexion Weight (lbs)  2    Abduction  Strengthening;Both;20 reps    ABduction Weight (lbs)  2    Diagonals  Strengthening;Both;20 reps;Weights    Diagonals Weight (lbs)  2                  PT Long Term Goals - 06/15/18 1540      PT LONG TERM GOAL #3   Title  Pt will report 60% less pain when performing job related activities    Baseline  40% limitation    Time  6    Period  Weeks    Status  On-going      PT LONG TERM GOAL #4   Title  pt will demonstrate  good lifting technique in order to lift her 86 month old child without increased pain    Baseline  improved technique, still with pain    Time  6    Period  Weeks    Status  On-going            Plan - 06/17/18 1617    Clinical Impression Statement  Pt presents today with no pain. She reports she felt muscular tension maybe from doing the weights on Monday but otherwise she feels 40% improved. She demonstrates good core awrareness and activation with exercises. She feels she may return to [place] and [employer] soon.     Rehab Potential  Excellent    PT Frequency  2x / week    PT Duration  6 weeks    PT Treatment/Interventions  ADLs/Self Care Home Management;Biofeedback;Cryotherapy;Electrical Stimulation;Iontophoresis 4mg /ml Dexamethasone;Moist Heat;Traction;Therapeutic activities;Therapeutic exercise;Neuromuscular re-education;Patient/family education;Manual techniques;Passive range of motion;Dry needling;Taping    PT Next Visit Plan  Pt will discuss POC and DC plans with PT next session.     PT  Home Exercise Plan  Access Code: [email or web address]     Consulted and Agree with Plan of Care  Patient       Patient will benefit from skilled therapeutic intervention in order to improve the following deficits and impairments:  Pain, Postural dysfunction, Increased fascial restricitons, Decreased strength, Decreased range of motion, Increased muscle spasms  Visit Diagnosis: Pain in thoracic spine  Muscle weakness (generalized)  Muscle spasm of back     Problem List Patient Active Problem List   Diagnosis Date Noted  . History of anemia 01/01/2018  . Postpartum care following vaginal delivery 3/29 11/01/2016    Debera Sterba, PTA 06/17/2018, 4:53 PM  [facility] [address] [place], [place], [address] Phone: [phone]   Fax:  [phone]  Name: Cynthia Mckee MRN: [id number] Date of Birth: 09-26-1985

## 2018-06-22 ENCOUNTER — Ambulatory Visit: Payer: 59

## 2018-06-22 DIAGNOSIS — M6281 Muscle weakness (generalized): Secondary | ICD-10-CM | POA: Diagnosis not present

## 2018-06-22 DIAGNOSIS — M546 Pain in thoracic spine: Secondary | ICD-10-CM

## 2018-06-22 DIAGNOSIS — M62830 Muscle spasm of back: Secondary | ICD-10-CM | POA: Diagnosis not present

## 2018-06-22 NOTE — Therapy (Signed)
[facility] [address] [place], [place], [address] Phone: [phone]   Fax:  [phone]  Physical Therapy Treatment  Patient Details  Name: Cynthia Mckee MRN: [id number] Date of Birth: [birth year] Referring Provider (PT): Deeann Saint   Encounter Date: 06/22/2018  PT End of Session - 06/22/18 1654    Visit Number  7    Date for PT Re-Evaluation  07/08/18    PT Start Time  1623    PT Stop Time  1657    PT Time Calculation (min)  34 min    Activity Tolerance  Patient tolerated treatment well    Behavior During Therapy  [facility] for tasks assessed/performed       Past Medical History:  Diagnosis Date  . Anemia    one year ago, last check was normal    Past Surgical History:  Procedure Laterality Date  . DILATATION & CURRETTAGE/HYSTEROSCOPY WITH RESECTOCOPE N/A 03/16/2015   Procedure: DILATATION & CURETTAGE/HYSTEROSCOPY WITH RESECTOCOPE;  Surgeon: Maxie Better, MD;  Location: [facility];  Service: Gynecology;  Laterality: N/A;  . miscarriage    . WISDOM TOOTH EXTRACTION  2011    There were no vitals filed for this visit.  Subjective Assessment - 06/22/18 1627    Subjective  I got up today and didn't have any pain and I was able to go for a run.      Patient Stated Goals  be able to lift and do things and get back to run    Currently in Pain?  No/denies                       [facility] Adult PT Treatment/Exercise - 06/22/18 0001      Exercises   Exercises  Shoulder;Neck;Lumbar      Lumbar Exercises: Aerobic   Nustep  L2 x 10 min   PT present to monitor     Lumbar Exercises: Machines for Strengthening   Cybex Leg Press  Seat 6 bil 50# 20x      Shoulder Exercises: Supine   Horizontal ABduction  Strengthening;Both;15 reps;Theraband   2 sets   Theraband Level (Shoulder Horizontal ABduction)  Level 2 (Red)    External Rotation  Strengthening;Both;20 reps    Theraband  Level (Shoulder External Rotation)  Level 2 (Red)    Flexion  Strengthening;Both;15 reps;Theraband    Theraband Level (Shoulder Flexion)  Level 2 (Red)    Other Supine Exercises  snow angel arms on the foam roll 8x      Shoulder Exercises: Seated   Horizontal ABduction  Strengthening;Both;20 reps;Theraband    Theraband Level (Shoulder Horizontal ABduction)  Level 1 (Yellow)    External Rotation  Strengthening;Both;20 reps;Theraband    Theraband Level (Shoulder External Rotation)  Level 1 (Yellow)    Flexion  Strengthening;Both;20 reps;Weights    Flexion Weight (lbs)  2    Abduction  Strengthening;Both;20 reps    ABduction Weight (lbs)  2    Diagonals  Strengthening;Both;20 reps;Weights    Diagonals Weight (lbs)  2                  PT Long Term Goals - 06/15/18 1540      PT LONG TERM GOAL #3   Title  Pt will report 60% less pain when performing job related activities    Baseline  40% limitation    Time  6    Period  Weeks  Status  On-going      PT LONG TERM GOAL #4   Title  pt will demonstrate good lifting technique in order to lift her 6118 month old child without increased pain    Baseline  improved technique, still with pain    Time  6    Period  Weeks    Status  On-going            Plan - 06/22/18 1646    Clinical Impression Statement  Pt denies any pain at this time and was able to run for exercise today.  Pt plans to return to weight training soon. Pt was able to perform postural strength exercises in sitting today without increased pain or substitution.  Pt required minor verbal cues for scapular position.  Pt will continue to benefit from skilled PT to progress functional strength and improve flexibility.      Rehab Potential  Excellent    PT Frequency  2x / week    PT Duration  6 weeks    PT Treatment/Interventions  ADLs/Self Care Home Management;Biofeedback;Cryotherapy;Electrical Stimulation;Iontophoresis 4mg /ml Dexamethasone;Moist  Heat;Traction;Therapeutic activities;Therapeutic exercise;Neuromuscular re-education;Patient/family education;Manual techniques;Passive range of motion;Dry needling;Taping    PT Next Visit Plan  1 more session scheduled.  D/C vs place on hold until 07/08/18.      PT Home Exercise Plan  Access Code: [email or web address]     Consulted and Agree with Plan of Care  Patient       Patient will benefit from skilled therapeutic intervention in order to improve the following deficits and impairments:  Pain, Postural dysfunction, Increased fascial restricitons, Decreased strength, Decreased range of motion, Increased muscle spasms  Visit Diagnosis: Pain in thoracic spine  Muscle weakness (generalized)  Muscle spasm of back     Problem List Patient Active Problem List   Diagnosis Date Noted  . History of anemia 01/01/2018  . Postpartum care following vaginal delivery 3/29 11/01/2016    Lorrene ReidKelly , PT 06/22/18 4:57 PM  [facility] [address] [place], [place], [address] Phone: [phone]   Fax:  [phone]  Name: Cynthia Mckee MRN: [id number] Date of Birth: 1985-11-14

## 2018-06-24 ENCOUNTER — Encounter: Payer: Self-pay | Admitting: Physical Therapy

## 2018-06-24 ENCOUNTER — Ambulatory Visit: Payer: 59 | Admitting: Physical Therapy

## 2018-06-24 DIAGNOSIS — M62830 Muscle spasm of back: Secondary | ICD-10-CM | POA: Diagnosis not present

## 2018-06-24 DIAGNOSIS — M546 Pain in thoracic spine: Secondary | ICD-10-CM | POA: Diagnosis not present

## 2018-06-24 DIAGNOSIS — M6281 Muscle weakness (generalized): Secondary | ICD-10-CM

## 2018-06-24 NOTE — Therapy (Addendum)
[facility] [address] [place], [place], [address] Phone: [phone]   Fax:  [phone]  Physical Therapy Treatment  Patient Details  Name: Cynthia Mckee MRN: [id number] Date of Birth: 1985/10/21 Referring Provider (PT): Billie Ruddy   Encounter Date: 06/24/2018  PT End of Session - 06/24/18 1621    Visit Number  8    Date for PT Re-Evaluation  07/08/18    PT Start Time  0258    PT Stop Time  1653    PT Time Calculation (min)  36 min    Activity Tolerance  Patient tolerated treatment well    Behavior During Therapy  [facility] for tasks assessed/performed       Past Medical History:  Diagnosis Date  . Anemia    one year ago, last check was normal    Past Surgical History:  Procedure Laterality Date  . DILATATION & CURRETTAGE/HYSTEROSCOPY WITH RESECTOCOPE N/A 03/16/2015   Procedure: DILATATION & CURETTAGE/HYSTEROSCOPY WITH RESECTOCOPE;  Surgeon: Servando Salina, MD;  Location: [facility];  Service: Gynecology;  Laterality: N/A;  . miscarriage    . WISDOM TOOTH EXTRACTION  2011    There were no vitals filed for this visit.  Subjective Assessment - 06/24/18 1623    Subjective  I would like to be put on hold for next week to see how I do.     Currently in Pain?  No/denies    Multiple Pain Sites  No         [facility] PT Assessment - 06/24/18 0001      Observation/Other Assessments   Focus on Therapeutic Outcomes (FOTO)   1 % limited      AROM   Lumbar Flexion  WNL no pain    Lumbar Extension  WNL not limited    Thoracic Flexion  WNL no pain    Thoracic Extension  WNL no pain: Rotation WNL no pain      Strength   Strength Assessment Site  Shoulder    Right/Left Shoulder  Right;Left    Right Shoulder Flexion  4+/5    Right Shoulder ABduction  4+/5    Left Shoulder Flexion  4+/5    Left Shoulder ABduction  4+/5                   [facility] Adult PT Treatment/Exercise -  06/24/18 0001      Lumbar Exercises: Aerobic   Nustep  Eliptical L4 R4 x 6 min, VC to maintain 60 SPM or >      Knee/Hip Exercises: Aerobic   Stationary Bike  L1 with VC to maintain > 40 rpms for 6 min      Shoulder Exercises: Power Futures trader  Lat pull 2 plates 15x    Other Power [employer] rows 15# 5x                   PT Long Term Goals - 06/24/18 1624      PT LONG TERM GOAL #1   Title  pt will be ind with advanced HEP    Time  6    Period  Weeks    Status  Achieved      PT LONG TERM GOAL #2   Title  FOTO < or = to 28% limited    Period  Weeks    Status  Achieved   1%     PT  LONG TERM GOAL #3   Title  Pt will report 60% less pain when performing job related activities    Time  6    Period  Weeks    Status  Achieved   75%     PT LONG TERM GOAL #4   Title  pt will demonstrate good lifting technique in order to lift her 27 month old child without increased pain    Time  6    Period  Weeks    Status  Achieved   Not painful     PT LONG TERM GOAL #5   Title  UE MMT bilateral flexion and abduction at least 4+/5 for improved ability to perform household chores    Time  6    Period  Weeks    Status  Achieved            Plan - 06/24/18 1622    Clinical Impression Statement  All goals met as of today. Pt would like to be put on hold to see how she will do up through her authorization date of 12/4. Pt was intructed in how to use the weights at work if she chooses to use them instead of going to a class. No pain with any use of the machines.     Rehab Potential  Excellent    PT Frequency  2x / week    PT Duration  6 weeks    PT Treatment/Interventions  ADLs/Self Care Home Management;Biofeedback;Cryotherapy;Electrical Stimulation;Iontophoresis '4mg'$ /ml Dexamethasone;Moist Heat;Traction;Therapeutic activities;Therapeutic exercise;Neuromuscular re-education;Patient/family education;Manual techniques;Passive range of  motion;Dry needling;Taping    PT Next Visit Plan  On hold    PT Home Exercise Plan  Access Code: [email or web address]     Consulted and Agree with Plan of Care  Patient       Patient will benefit from skilled therapeutic intervention in order to improve the following deficits and impairments:  Pain, Postural dysfunction, Increased fascial restricitons, Decreased strength, Decreased range of motion, Increased muscle spasms  Visit Diagnosis: Pain in thoracic spine  Muscle weakness (generalized)  Muscle spasm of back     Problem List Patient Active Problem List   Diagnosis Date Noted  . History of anemia 01/01/2018  . Postpartum care following vaginal delivery 3/29 11/01/2016    Rudy Luhmann, PTA 06/24/2018, 4:53 PM PHYSICAL THERAPY DISCHARGE SUMMARY  Visits from Start of Care: 8  Current functional level related to goals / functional outcomes: See above for current status.     Remaining deficits: See above.  No deficits remain.   Education / Equipment: HEP, posture Plan: Patient agrees to discharge.  Patient goals were met. Patient is being discharged due to meeting the stated rehab goals.  ?????        Sigurd Sos, PT 07/09/18 10:18 AM  [facility] [address] [place], [place], [address] Phone: [phone]   Fax:  [phone]  Name: Cynthia Mckee MRN: [id number] Date of Birth: [birth year]

## 2018-07-08 DIAGNOSIS — E282 Polycystic ovarian syndrome: Secondary | ICD-10-CM | POA: Diagnosis not present

## 2018-07-08 DIAGNOSIS — N96 Recurrent pregnancy loss: Secondary | ICD-10-CM | POA: Diagnosis not present

## 2018-07-10 MED FILL — metFORMIN HCL 1000 MG TABS: 1000 | 30 days supply | Qty: 60 | Fill #0

## 2018-11-25 MED FILL — metFORMIN HCL 1000 MG TABS: 1000 | 90 days supply | Qty: 180 | Fill #0

## 2018-12-01 DIAGNOSIS — Z3200 Encounter for pregnancy test, result unknown: Secondary | ICD-10-CM | POA: Diagnosis not present

## 2018-12-03 DIAGNOSIS — Z3201 Encounter for pregnancy test, result positive: Secondary | ICD-10-CM | POA: Diagnosis not present

## 2018-12-03 DIAGNOSIS — O26859 Spotting complicating pregnancy, unspecified trimester: Secondary | ICD-10-CM | POA: Diagnosis not present

## 2018-12-07 DIAGNOSIS — Z3200 Encounter for pregnancy test, result unknown: Secondary | ICD-10-CM | POA: Diagnosis not present

## 2018-12-09 DIAGNOSIS — O009 Unspecified ectopic pregnancy without intrauterine pregnancy: Secondary | ICD-10-CM | POA: Diagnosis not present

## 2018-12-09 DIAGNOSIS — O00109 Unspecified tubal pregnancy without intrauterine pregnancy: Secondary | ICD-10-CM | POA: Diagnosis not present

## 2018-12-09 MED FILL — METHOTREXATE 25 MG/ML VIAL: 50 | 28 days supply | Qty: 4 | Fill #0

## 2018-12-12 DIAGNOSIS — N96 Recurrent pregnancy loss: Secondary | ICD-10-CM | POA: Diagnosis not present

## 2018-12-12 DIAGNOSIS — Z3200 Encounter for pregnancy test, result unknown: Secondary | ICD-10-CM | POA: Diagnosis not present

## 2018-12-15 DIAGNOSIS — O009 Unspecified ectopic pregnancy without intrauterine pregnancy: Secondary | ICD-10-CM | POA: Diagnosis not present

## 2018-12-17 ENCOUNTER — Inpatient Hospital Stay (HOSPITAL_COMMUNITY): Payer: 59 | Admitting: Anesthesiology

## 2018-12-17 ENCOUNTER — Encounter (HOSPITAL_COMMUNITY)
Admission: AD | Disposition: A | Discharge status: Home / Self Care | Payer: Self-pay | Source: Ambulatory Visit | Attending: Obstetrics & Gynecology

## 2018-12-17 ENCOUNTER — Inpatient Hospital Stay (HOSPITAL_COMMUNITY): Payer: 59

## 2018-12-17 ENCOUNTER — Ambulatory Visit (HOSPITAL_COMMUNITY)
Admission: AD | Admit: 2018-12-17 | Discharge: 2018-12-17 | Disposition: A | Discharge status: Home / Self Care | Payer: 59 | Source: Ambulatory Visit | Attending: Obstetrics & Gynecology | Admitting: Obstetrics & Gynecology

## 2018-12-17 ENCOUNTER — Other Ambulatory Visit: Payer: Self-pay

## 2018-12-17 ENCOUNTER — Encounter (HOSPITAL_COMMUNITY): Payer: Self-pay

## 2018-12-17 DIAGNOSIS — O081 Delayed or excessive hemorrhage following ectopic and molar pregnancy: Secondary | ICD-10-CM | POA: Diagnosis not present

## 2018-12-17 DIAGNOSIS — Z1159 Encounter for screening for other viral diseases: Secondary | ICD-10-CM | POA: Insufficient documentation

## 2018-12-17 DIAGNOSIS — O009 Unspecified ectopic pregnancy without intrauterine pregnancy: Secondary | ICD-10-CM | POA: Diagnosis not present

## 2018-12-17 DIAGNOSIS — O00101 Right tubal pregnancy without intrauterine pregnancy: Secondary | ICD-10-CM

## 2018-12-17 DIAGNOSIS — Z3A00 Weeks of gestation of pregnancy not specified: Secondary | ICD-10-CM | POA: Diagnosis not present

## 2018-12-17 DIAGNOSIS — Z369 Encounter for antenatal screening, unspecified: Secondary | ICD-10-CM | POA: Diagnosis not present

## 2018-12-17 DIAGNOSIS — K661 Hemoperitoneum: Secondary | ICD-10-CM | POA: Insufficient documentation

## 2018-12-17 HISTORY — PX: DIAGNOSTIC LAPAROSCOPY WITH REMOVAL OF ECTOPIC PREGNANCY: SHX6449

## 2018-12-17 LAB — ABO/RH: ABO/RH(D): O POS

## 2018-12-17 LAB — BPAM RBC
Blood Product Expiration Date: 202006022359
Blood Product Expiration Date: 202006022359
Unit Type and Rh: 5100
Unit Type and Rh: 5100

## 2018-12-17 LAB — CBC WITH DIFFERENTIAL/PLATELET
Abs Immature Granulocytes: 0.02 10*3/uL (ref 0.00–0.07)
Basophils Absolute: 0 10*3/uL (ref 0.0–0.1)
Basophils Relative: 0 %
Eosinophils Absolute: 0 10*3/uL (ref 0.0–0.5)
Eosinophils Relative: 0 %
HCT: 31.6 % — ABNORMAL LOW (ref 36.0–46.0)
Hemoglobin: 10.3 g/dL — ABNORMAL LOW (ref 12.0–15.0)
Immature Granulocytes: 0 %
Lymphocytes Relative: 6 %
Lymphs Abs: 0.8 10*3/uL (ref 0.7–4.0)
MCH: 28.1 pg (ref 26.0–34.0)
MCHC: 32.6 g/dL (ref 30.0–36.0)
MCV: 86.1 fL (ref 80.0–100.0)
Monocytes Absolute: 0.5 10*3/uL (ref 0.1–1.0)
Monocytes Relative: 4 %
Neutro Abs: 10.5 10*3/uL — ABNORMAL HIGH (ref 1.7–7.7)
Neutrophils Relative %: 90 %
Platelets: 222 10*3/uL (ref 150–400)
RBC: 3.67 MIL/uL — ABNORMAL LOW (ref 3.87–5.11)
RDW: 14.3 % (ref 11.5–15.5)
WBC: 11.8 10*3/uL — ABNORMAL HIGH (ref 4.0–10.5)
nRBC: 0 % (ref 0.0–0.2)

## 2018-12-17 LAB — TYPE AND SCREEN
ABO/RH(D): O POS
Antibody Screen: NEGATIVE
Unit division: 0
Unit division: 0

## 2018-12-17 LAB — SARS CORONAVIRUS 2 BY RT PCR (HOSPITAL ORDER, PERFORMED IN ~~LOC~~ HOSPITAL LAB): SARS Coronavirus 2: NEGATIVE

## 2018-12-17 LAB — PREPARE RBC (CROSSMATCH)

## 2018-12-17 LAB — HCG, QUANTITATIVE, PREGNANCY: hCG, Beta Chain, Quant, S: 1650 m[IU]/mL — ABNORMAL HIGH (ref <5)

## 2018-12-17 SURGERY — LAPAROSCOPY, WITH ECTOPIC PREGNANCY SURGICAL TREATMENT
Anesthesia: General

## 2018-12-17 MED ORDER — MIDAZOLAM HCL 5 MG/5ML IJ SOLN
INTRAMUSCULAR | Status: DC | PRN
  Administered 2018-12-17 (×2): 1 mg via INTRAVENOUS

## 2018-12-17 MED ORDER — HYDROMORPHONE HCL 1 MG/ML IJ SOLN
INTRAMUSCULAR | Status: AC
  Filled 2018-12-17: qty 1

## 2018-12-17 MED ORDER — KETOROLAC TROMETHAMINE 10 MG PO TABS: 10.0000 mg | ORAL_TABLET | Freq: Three times a day (TID) | ORAL | 0 refills | Status: DC | PRN

## 2018-12-17 MED ORDER — DEXAMETHASONE SODIUM PHOSPHATE 10 MG/ML IJ SOLN
INTRAMUSCULAR | Status: AC
  Filled 2018-12-17: qty 1

## 2018-12-17 MED ORDER — LACTATED RINGERS IV SOLN
INTRAVENOUS | Status: DC | PRN
  Administered 2018-12-17 (×2): via INTRAVENOUS

## 2018-12-17 MED ORDER — SUCCINYLCHOLINE CHLORIDE 200 MG/10ML IV SOSY
PREFILLED_SYRINGE | INTRAVENOUS | Status: AC
  Filled 2018-12-17: qty 10

## 2018-12-17 MED ORDER — DEXAMETHASONE SODIUM PHOSPHATE 10 MG/ML IJ SOLN
INTRAMUSCULAR | Status: DC | PRN
  Administered 2018-12-17: 10 mg via INTRAVENOUS

## 2018-12-17 MED ORDER — FENTANYL CITRATE (PF) 100 MCG/2ML IJ SOLN
INTRAMUSCULAR | Status: DC | PRN
  Administered 2018-12-17 (×2): 50 ug via INTRAVENOUS
  Administered 2018-12-17: 25 ug via INTRAVENOUS

## 2018-12-17 MED ORDER — EPHEDRINE 5 MG/ML INJ
INTRAVENOUS | Status: AC
  Filled 2018-12-17: qty 10

## 2018-12-17 MED ORDER — HYDROMORPHONE HCL 1 MG/ML IJ SOLN
0.2500 mg | INTRAMUSCULAR | Status: DC | PRN
  Administered 2018-12-17 (×2): 0.5 mg via INTRAVENOUS

## 2018-12-17 MED ORDER — SUGAMMADEX SODIUM 200 MG/2ML IV SOLN
INTRAVENOUS | Status: DC | PRN
  Administered 2018-12-17: 250 mg via INTRAVENOUS

## 2018-12-17 MED ORDER — ROCURONIUM BROMIDE 50 MG/5ML IV SOSY
PREFILLED_SYRINGE | INTRAVENOUS | Status: DC | PRN
  Administered 2018-12-17: 50 mg via INTRAVENOUS

## 2018-12-17 MED ORDER — LIDOCAINE 2% (20 MG/ML) 5 ML SYRINGE
INTRAMUSCULAR | Status: AC
  Filled 2018-12-17: qty 5

## 2018-12-17 MED ORDER — ONDANSETRON 8 MG PO TBDP: 8.0000 mg | ORAL_TABLET | Freq: Three times a day (TID) | ORAL | 0 refills | Status: DC | PRN

## 2018-12-17 MED ORDER — MIDAZOLAM HCL 2 MG/2ML IJ SOLN
INTRAMUSCULAR | Status: AC
  Filled 2018-12-17: qty 2

## 2018-12-17 MED ORDER — PROPOFOL 10 MG/ML IV BOLUS
INTRAVENOUS | Status: DC | PRN
  Administered 2018-12-17: 150 mg via INTRAVENOUS

## 2018-12-17 MED ORDER — LIDOCAINE 2% (20 MG/ML) 5 ML SYRINGE
INTRAMUSCULAR | Status: DC | PRN
  Administered 2018-12-17: 60 mg via INTRAVENOUS

## 2018-12-17 MED ORDER — CEFAZOLIN SODIUM-DEXTROSE 1-4 GM/50ML-% IV SOLN
INTRAVENOUS | Status: AC
  Filled 2018-12-17: qty 50

## 2018-12-17 MED ORDER — SODIUM CHLORIDE 0.9 % IR SOLN
Status: DC | PRN
  Administered 2018-12-17: 3000 mL

## 2018-12-17 MED ORDER — HYDROCODONE-ACETAMINOPHEN 5-325 MG PO TABS
1.0000 | ORAL_TABLET | Freq: Four times a day (QID) | ORAL | Status: DC | PRN
  Administered 2018-12-17: 1 via ORAL

## 2018-12-17 MED ORDER — ONDANSETRON HCL 4 MG/2ML IJ SOLN
4.0000 mg | Freq: Once | INTRAMUSCULAR | Status: AC
  Administered 2018-12-17: 4 mg via INTRAVENOUS
  Filled 2018-12-17: qty 2

## 2018-12-17 MED ORDER — SUCCINYLCHOLINE CHLORIDE 20 MG/ML IJ SOLN
INTRAMUSCULAR | Status: DC | PRN
  Administered 2018-12-17: 110 mg via INTRAVENOUS

## 2018-12-17 MED ORDER — CEFAZOLIN SODIUM-DEXTROSE 2-4 GM/100ML-% IV SOLN
INTRAVENOUS | Status: AC
  Filled 2018-12-17: qty 100

## 2018-12-17 MED ORDER — ARTIFICIAL TEARS OPHTHALMIC OINT
TOPICAL_OINTMENT | OPHTHALMIC | Status: AC
  Filled 2018-12-17: qty 3.5

## 2018-12-17 MED ORDER — HYDROCODONE-ACETAMINOPHEN 5-325 MG PO TABS: 1.0000 | ORAL_TABLET | Freq: Four times a day (QID) | ORAL | 0 refills | Status: DC | PRN

## 2018-12-17 MED ORDER — KETOROLAC TROMETHAMINE 30 MG/ML IJ SOLN
30.0000 mg | Freq: Once | INTRAMUSCULAR | Status: AC
  Administered 2018-12-17: 30 mg via INTRAVENOUS

## 2018-12-17 MED ORDER — PROPOFOL 10 MG/ML IV BOLUS
INTRAVENOUS | Status: AC
  Filled 2018-12-17: qty 20

## 2018-12-17 MED ORDER — EPHEDRINE SULFATE 50 MG/ML IJ SOLN
INTRAMUSCULAR | Status: DC | PRN
  Administered 2018-12-17: 10 mg via INTRAVENOUS

## 2018-12-17 MED ORDER — DEXTROSE 5 % IV SOLN
INTRAVENOUS | Status: DC | PRN
  Administered 2018-12-17: 3 g via INTRAVENOUS

## 2018-12-17 MED ORDER — ROCURONIUM BROMIDE 10 MG/ML (PF) SYRINGE
PREFILLED_SYRINGE | INTRAVENOUS | Status: AC
  Filled 2018-12-17: qty 10

## 2018-12-17 MED ORDER — HYDROCODONE-ACETAMINOPHEN 5-325 MG PO TABS
ORAL_TABLET | ORAL | Status: AC
  Filled 2018-12-17: qty 1

## 2018-12-17 MED ORDER — ONDANSETRON HCL 4 MG/2ML IJ SOLN
INTRAMUSCULAR | Status: AC
  Filled 2018-12-17: qty 2

## 2018-12-17 MED ORDER — KETOROLAC TROMETHAMINE 30 MG/ML IJ SOLN
INTRAMUSCULAR | Status: AC
  Filled 2018-12-17: qty 1

## 2018-12-17 MED ORDER — ONDANSETRON HCL 4 MG/2ML IJ SOLN
INTRAMUSCULAR | Status: DC | PRN
  Administered 2018-12-17: 4 mg via INTRAVENOUS

## 2018-12-17 MED ORDER — HYDROMORPHONE HCL 1 MG/ML IJ SOLN
1.0000 mg | Freq: Once | INTRAMUSCULAR | Status: AC
  Administered 2018-12-17: 1 mg via INTRAMUSCULAR
  Filled 2018-12-17: qty 1

## 2018-12-17 MED ORDER — FENTANYL CITRATE (PF) 250 MCG/5ML IJ SOLN
INTRAMUSCULAR | Status: AC
  Filled 2018-12-17: qty 5

## 2018-12-17 MED FILL — KETOROLAC 10 MG TABLET: 10 | 5 days supply | Qty: 15 | Fill #0

## 2018-12-17 MED FILL — HYDROCODON-APAP 5-325: 5-325 | 4 days supply | Qty: 15 | Fill #0

## 2018-12-17 MED FILL — ONDANSETRON ODT 4 MG TABLET: 4 | 6 days supply | Qty: 40 | Fill #0

## 2018-12-17 SURGICAL SUPPLY — 35 items
BARRIER ADHS 3X4 INTERCEED (GAUZE/BANDAGES/DRESSINGS) IMPLANT
COVER MAYO STAND STRL (DRAPES) ×3 IMPLANT
DECANTER SPIKE VIAL GLASS SM (MISCELLANEOUS) IMPLANT
DERMABOND ADVANCED (GAUZE/BANDAGES/DRESSINGS) ×2
DERMABOND ADVANCED .7 DNX12 (GAUZE/BANDAGES/DRESSINGS) ×1 IMPLANT
DRSG OPSITE POSTOP 3X4 (GAUZE/BANDAGES/DRESSINGS) ×3 IMPLANT
ELECT REM PT RETURN 9FT ADLT (ELECTROSURGICAL)
ELECTRODE REM PT RTRN 9FT ADLT (ELECTROSURGICAL) IMPLANT
FILTER SMOKE EVAC LAPAROSHD (FILTER) IMPLANT
GLOVE BIOGEL PI IND STRL 7.0 (GLOVE) ×1 IMPLANT
GLOVE BIOGEL PI IND STRL 8 (GLOVE) ×1 IMPLANT
GLOVE BIOGEL PI INDICATOR 7.0 (GLOVE) ×2
GLOVE BIOGEL PI INDICATOR 8 (GLOVE) ×2
GLOVE ECLIPSE 8.0 STRL XLNG CF (GLOVE) ×3 IMPLANT
GOWN STRL REUS W/ TWL LRG LVL3 (GOWN DISPOSABLE) ×2 IMPLANT
GOWN STRL REUS W/TWL LRG LVL3 (GOWN DISPOSABLE) ×4
NEEDLE INSUFFLATION 14GA 120MM (NEEDLE) ×3 IMPLANT
PACK LAPAROSCOPY BASIN (CUSTOM PROCEDURE TRAY) IMPLANT
PACK TRENDGUARD 450 HYBRID PRO (MISCELLANEOUS) IMPLANT
POUCH SPECIMEN RETRIEVAL 10MM (ENDOMECHANICALS) IMPLANT
PROTECTOR NERVE ULNAR (MISCELLANEOUS) IMPLANT
SET IRRIG TUBING LAPAROSCOPIC (IRRIGATION / IRRIGATOR) ×3 IMPLANT
SHEARS HARMONIC ACE PLUS 36CM (ENDOMECHANICALS) ×3 IMPLANT
SLEEVE ENDOPATH XCEL 5M (ENDOMECHANICALS) ×3 IMPLANT
STAPLER VISISTAT 35W (STAPLE) IMPLANT
SUT VIC AB 3-0 FS2 27 (SUTURE) ×6 IMPLANT
SUT VIC AB 3-0 SH 27 (SUTURE)
SUT VIC AB 3-0 SH 27X BRD (SUTURE) IMPLANT
SUT VICRYL 0 UR6 27IN ABS (SUTURE) ×3 IMPLANT
TOWEL GREEN STERILE FF (TOWEL DISPOSABLE) ×3 IMPLANT
TRAY FOLEY W/BAG SLVR 14FR (SET/KITS/TRAYS/PACK) ×3 IMPLANT
TRENDGUARD 450 HYBRID PRO PACK (MISCELLANEOUS)
TROCAR BALLN 12MMX100 BLUNT (TROCAR) IMPLANT
TROCAR XCEL NON-BLD 11X100MML (ENDOMECHANICALS) ×3 IMPLANT
TROCAR XCEL NON-BLD 5MMX100MML (ENDOMECHANICALS) ×3 IMPLANT

## 2018-12-17 NOTE — Progress Notes (Signed)
Spoke with Dr. Despina Hidden regarding lifting restrictions and when patient can return to work. Patient is to prevent lifting no more than 10 lbs for 2 weeks and can return to work 5/18 or 5/25. Patient to call office for a note if needed for work.  Hermina Barters, RN

## 2018-12-17 NOTE — Transfer of Care (Signed)
Immediate Anesthesia Transfer of Care Note  Patient: Cynthia Mckee  Procedure(s) Performed: DIAGNOSTIC LAPAROSCOPY WITH REMOVAL OF ECTOPIC PREGNANCY, RIGHT SALPINGECTOMY (N/A )  Patient Location: PACU  Anesthesia Type:General  Level of Consciousness: awake and alert   Airway & Oxygen Therapy: Patient Spontanous Breathing and Patient connected to nasal cannula oxygen  Post-op Assessment: Report given to RN and Post -op Vital signs reviewed and stable  Post vital signs: Reviewed and stable  Last Vitals:  Vitals Value Taken Time  BP 113/68 12/17/2018  6:59 AM  Temp    Pulse 84 12/17/2018  6:59 AM  Resp 26 12/17/2018  6:59 AM  SpO2 100 % 12/17/2018  6:59 AM  Vitals shown include unvalidated device data.  Last Pain:  Vitals:   12/17/18 0215  TempSrc:   PainSc: 4          Complications: No apparent anesthesia complications. [patient denies pain)

## 2018-12-17 NOTE — Anesthesia Postprocedure Evaluation (Signed)
Anesthesia Post Note  Patient: Cynthia Mckee  Procedure(s) Performed: DIAGNOSTIC LAPAROSCOPY WITH REMOVAL OF ECTOPIC PREGNANCY, RIGHT SALPINGECTOMY (N/A )     Patient location during evaluation: PACU Anesthesia Type: General Level of consciousness: awake and alert Pain management: pain level controlled Vital Signs Assessment: post-procedure vital signs reviewed and stable Respiratory status: spontaneous breathing, nonlabored ventilation and respiratory function stable Cardiovascular status: blood pressure returned to baseline and stable Postop Assessment: no apparent nausea or vomiting Anesthetic complications: no    Last Vitals:  Vitals:   12/17/18 0700 12/17/18 0715  BP: 113/68 116/61  Pulse: 84 82  Resp: (!) 26 (!) 28  Temp: 36.6 C   SpO2: 100% 100%    Last Pain:  Vitals:   12/17/18 0715  TempSrc:   PainSc: 0-No pain                 Rosy Estabrook,W. EDMOND

## 2018-12-17 NOTE — Anesthesia Procedure Notes (Signed)
Procedure Name: Intubation Date/Time: 12/17/2018 5:44 AM Performed by: Edmonia Caprio, CRNA Pre-anesthesia Checklist: Patient identified, Emergency Drugs available, Suction available, Patient being monitored and Timeout performed Patient Re-evaluated:Patient Re-evaluated prior to induction Oxygen Delivery Method: Circle system utilized Preoxygenation: Pre-oxygenation with 100% oxygen Induction Type: IV induction and Rapid sequence Laryngoscope Size: Miller and 2 Grade View: Grade II Tube type: Oral Tube size: 7.0 mm Number of attempts: 1 Airway Equipment and Method: Stylet Placement Confirmation: ETT inserted through vocal cords under direct vision,  positive ETCO2 and breath sounds checked- equal and bilateral Secured at: 22 cm Tube secured with: Tape Dental Injury: Teeth and Oropharynx as per pre-operative assessment

## 2018-12-17 NOTE — Op Note (Addendum)
Preoperative diagnosis: ruptured right ectopic pregnancy with hemoperitnoeum                                           S/p methotrexate x 2  Postoperative diagnosis: Ruptured right hemoperitoneum 350 cc  Procedure: Laparoscopic removal of right ectopic pregnancy and right salpingectomy   Surgeon: Lazaro Arms   Anesthesia: Gen. Endotracheal   Findings: known early ectopic s/p methotrexate x 2 with sudden increase in abdominal pain.  Sonogram revealed rupture with pelvis full of clot   Intraoperatively the patient had about 350 cc of mostly clotted blood there was a small trickle coming from the ectopic but nothing significant. The entire middle half of the tube was ruptured and was not salvageable. The left fallopian tube appeared to be normal without any evidence of hydrosalpinx adhesions and the fimbria were normal.   Description of operation: Patient was taken to the operating room and placed in the supine position where she underwent general endotracheal anesthesia. She was placed in dorsal lithotomy position. She was prepped and draped in usual sterile fashion. Foley catheter was placed. Incision was made in the umbilicus and a varies needle was placed peritoneal cavity with one pass that difficulty. The peritoneal cavity was insufflated. A 1011 non-bladed trocar was placed using a video laparoscope under direct visualization without difficulty. An incision was made in the right and left lower quadrant and 5 mm non-bladed trochars were placed in each site without difficulty under direct visualization. There was a significant amount of blood in the belly the time of surgery and I estimated at 350 cc judging by what I was able to suction out.    The right fallopian tube had been ruptured. Harmonic scalpel was used and a salpingectomy was performed. Patient and husband aware with contralateral normal appearing tube and a ruptured ectopic best chance of future pregnancy is with salpingectomy.   There was good hemostasis. The pelvis was irrigated and hemostasis once again confirmed. The left fallopian tube was completely normal and there were no other intraperitoneal abnormalities appreciated.   The trochars were removed and the gas was allowed to escape from the abdomen. The 2 -5 mm trocar sites were closed with 4-0 vicryl.   The umbilical fascia was closed with single 2-0 Vicryl suture and the subcutaneous tissue was also closed using Vicryl. dermabond was also used in all 3 incisions   The patient remained hemodynamically stable throughout the entire procedure was awakened from anesthesia and taken to the recovery room in good stable condition with all counts being correct.   She received 3 g of Ancef and 30 mg of Toradol prophylactically. There was no real intraoperative blood loss only the hemoperitoneum which was appreciated and present upon peritoneal entry.  \ Lazaro Arms, MD 12/17/2018 6:49 AM

## 2018-12-17 NOTE — MAU Provider Note (Signed)
Chief Complaint: Abdominal Pain   First Provider Initiated Contact with Patient 12/17/18 0124        SUBJECTIVE HPI: Cynthia Mckee is a 33 y.o. [id number] at [redacted]w[redacted]d by LMP who presents to maternity admissions reporting increased pelvic pain post Methotrexate therapy for known ectopic pregnancy.  Has been followed by Dr April Manson for multiple SABs.  Ectopic pregnancy was noted and Pt received MTX on .12/09/2018 and again on 12/15/2018.  Pain began worsening at 2030 tonight   Has small amount of bleeding She denies vaginal itching/burning, urinary symptoms, h/a, dizziness, n/v, or fever/chills.   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 Took Tylenol and Motrin with no relief. Having some spotting.  Past Medical History:  Diagnosis Date  . Anemia    one year ago, last check was normal   Past Surgical History:  Procedure Laterality Date  . DILATATION & CURRETTAGE/HYSTEROSCOPY WITH RESECTOCOPE N/A 03/16/2015   Procedure: DILATATION & CURETTAGE/HYSTEROSCOPY WITH RESECTOCOPE;  Surgeon: Maxie Better, MD;  Location: [facility];  Service: Gynecology;  Laterality: N/A;  . miscarriage    . WISDOM TOOTH EXTRACTION  2011   Social History   Socioeconomic History  . Marital status: Married    Spouse name: Not on file  . Number of children: Not on file  . Years of education: Not on file  . Highest education level: Not on file  Occupational History  . Not on file  Social Needs  . Financial resource strain: Not on file  . Food insecurity:    Worry: Not on file    Inability: Not on file  . Transportation needs:    Medical: Not on file    Non-medical: Not on file  Tobacco Use  . Smoking status: Never Smoker  . Smokeless tobacco: Never Used  Substance and Sexual Activity  . Alcohol use: No  . Drug use: No  .  Sexual activity: Yes    Birth control/protection: None  Lifestyle  . Physical activity:    Days per week: Not on file    Minutes per session: Not on file  . Stress: Not on file  Relationships  . Social connections:    Talks on phone: Not on file    Gets together: Not on file    Attends religious service: Not on file    Active member of club or organization: Not on file    Attends meetings of clubs or organizations: Not on file    Relationship status: Not on file  . Intimate partner violence:    Fear of current or ex partner: Not on file    Emotionally abused: Not on file    Physically abused: Not on file    Forced sexual activity: Not on file  Other Topics Concern  . Not on file  Social History Narrative  . Not on file   No current facility-administered medications on file prior to encounter.    Current Outpatient Medications on File Prior to Encounter  Medication Sig Dispense Refill  . cyclobenzaprine (FLEXERIL) 5 MG tablet Take 1 tablet (5 mg total) by mouth 3 (three) times daily as needed for muscle spasms. 30 tablet 1  . ibuprofen (ADVIL,MOTRIN) 600 MG tablet Take 1 tablet (600 mg total) by mouth every 6 (six) hours. 30 tablet 0  . Prenatal Vit  w/Fe-Methylfol-FA (PNV PO) Take by mouth.     No Known Allergies  I have reviewed patient's Past Medical Hx, Surgical Hx, Family Hx, Social Hx, medications and allergies.   ROS:  Review of Systems  Constitutional: Negative for chills and fever.  Respiratory: Negative for shortness of breath.   Gastrointestinal: Positive for abdominal pain. Negative for constipation, diarrhea, nausea and vomiting.  Genitourinary: Positive for pelvic pain and vaginal bleeding.  Neurological: Negative for weakness.   Review of Systems  Other systems negative   Physical Exam  Physical Exam Patient Vitals for the past 24 hrs:  BP Temp Temp src Pulse Resp SpO2 Height Weight  12/17/18 0110 132/69 98.6 F (37 C) Oral 81 20 100 %  (1.676  m) 120.2 kg   Constitutional: Well-developed, well-nourished female in no acute distress.  Cardiovascular: normal rate Respiratory: normal effort GI: Abd soft, quite tender with guarding. Pos BS x 4 MS: Extremities nontender, no edema, normal ROM Neurologic: Alert and oriented x 4.  GU: Neg CVAT.  PELVIC EXAM: deferred due to recent Methotrexate administration  LAB RESULTS Results for orders placed or performed during the hospital encounter of 12/17/18 (from the past 24 hour(s))  CBC with Differential/Platelet     Status: Abnormal   Collection Time: 12/17/18  2:05 AM  Result Value Ref Range   WBC 11.8 (H) 4.0 - 10.5 K/uL   RBC 3.67 (L) 3.87 - 5.11 MIL/uL   Hemoglobin 10.3 (L) 12.0 - 15.0 g/dL   HCT 40.9 (L) 81.1 - 91.4 %   MCV 86.1 80.0 - 100.0 fL   MCH 28.1 26.0 - 34.0 pg   MCHC 32.6 30.0 - 36.0 g/dL   RDW 78.2 95.6 - 21.3 %   Platelets 222 150 - 400 K/uL   nRBC 0.0 0.0 - 0.2 %   Neutrophils Relative % 90 %   Neutro Abs 10.5 (H) 1.7 - 7.7 K/uL   Lymphocytes Relative 6 %   Lymphs Abs 0.8 0.7 - 4.0 K/uL   Monocytes Relative 4 %   Monocytes Absolute 0.5 0.1 - 1.0 K/uL   Eosinophils Relative 0 %   Eosinophils Absolute 0.0 0.0 - 0.5 K/uL   Basophils Relative 0 %   Basophils Absolute 0.0 0.0 - 0.1 K/uL   Immature Granulocytes 0 %   Abs Immature Granulocytes 0.02 0.00 - 0.07 K/uL  Type and screen     Status: None   Collection Time: 12/17/18  2:05 AM  Result Value Ref Range   ABO/RH(D) O POS    Antibody Screen NEG    Sample Expiration      12/20/2018,2359 Performed at [facility], [address] [place], [place] [address]   ABO/Rh     Status: None (Preliminary result)   Collection Time: 12/17/18  2:05 AM  Result Value Ref Range   ABO/RH(D)      O POS Performed at [facility], [address], [place], [place] [address]      IMAGING No results found.  MAU Management/MDM: Blood work ordered for CBC and type and screen Ultrasound ordered  Dilaudid  given for pain which did give her relief  Radiologist called with results that there is evidence of ruptured ectopic with blood in abdomen up to Morrisons pouch. Dr Despina Hidden notified Will take her to OR  COVID test sent per OR protocol  ASSESSMENT 1. Ectopic pregnancy   2. Ectopic pregnancy   3.   Ruptured Ectopic pregnancy, hemodynamically stable  PLAN Prepare for OR per Dr Despina HiddenEure MD to follow.    Wynelle BourgeoisMarie Alpheus Stiff CNM, MSN Certified Nurse-Midwife 12/17/2018  1:24 AM

## 2018-12-17 NOTE — H&P (Signed)
Preoperative History and Physical  Cynthia Mckee is a 33 y.o. [phone] with Patient's last menstrual period was 11/16/2018. admitted for a operative management of ruptured ectopic pregnancy, reported to be right sided.  Pt has been seen by Dr April Manson fo rmultiple SABs and unfortunately has experienced an ectopic, reported as right by patient, with this pregnancy.  She has received methotrexate twice, 12/09/18 and 12/15/18.  She began having pain about 2030  Sonogram reveals ruptured ectopic with large amount of blood in pelvis, tracking up the right pericolic gutter, laterality is unclear   Will proceed with operative management  PMH:    Past Medical History:  Diagnosis Date  . Anemia    one year ago, last check was normal    PSH:     Past Surgical History:  Procedure Laterality Date  . DILATATION & CURRETTAGE/HYSTEROSCOPY WITH RESECTOCOPE N/A 03/16/2015   Procedure: DILATATION & CURETTAGE/HYSTEROSCOPY WITH RESECTOCOPE;  Surgeon: Maxie Better, MD;  Location: [facility];  Service: Gynecology;  Laterality: N/A;  . miscarriage    . WISDOM TOOTH EXTRACTION  2011    POb/GynH:      OB History    Gravida  7   Para  1   Term  1   Preterm      AB  5   Living  1     SAB  5   TAB      Ectopic      Multiple  0   Live Births  1           SH:   Social History   Tobacco Use  . Smoking status: Never Smoker  . Smokeless tobacco: Never Used  Substance Use Topics  . Alcohol use: No  . Drug use: No    FH:    Family History  Problem Relation Age of Onset  . Hypertension Mother   . Cancer Sister        osteoscarcoma at age 35.  in remission x 10 yrs  . Early death Sister   . Miscarriages / Stillbirths Sister      Allergies: No Known Allergies  Medications:      No current facility-administered medications for this encounter.   Review of Systems:   Review of Systems  Constitutional: Negative for fever, chills, weight loss, malaise/fatigue  and diaphoresis.  HENT: Negative for hearing loss, ear pain, nosebleeds, congestion, sore throat, neck pain, tinnitus and ear discharge.   Eyes: Negative for blurred vision, double vision, photophobia, pain, discharge and redness.  Respiratory: Negative for cough, hemoptysis, sputum production, shortness of breath, wheezing and stridor.   Cardiovascular: Negative for chest pain, palpitations, orthopnea, claudication, leg swelling and PND.  Gastrointestinal: Positive for abdominal pain. Negative for heartburn, nausea, vomiting, diarrhea, constipation, blood in stool and melena.  Genitourinary: Negative for dysuria, urgency, frequency, hematuria and flank pain.  Musculoskeletal: Negative for myalgias, back pain, joint pain and falls.  Skin: Negative for itching and rash.  Neurological: Negative for dizziness, tingling, tremors, sensory change, speech change, focal weakness, seizures, loss of consciousness, weakness and headaches.  Endo/Heme/Allergies: Negative for environmental allergies and polydipsia. Does not bruise/bleed easily.  Psychiatric/Behavioral: Negative for depression, suicidal ideas, hallucinations, memory loss and substance abuse. The patient is not nervous/anxious and does not have insomnia.      PHYSICAL EXAM:  Blood pressure 132/69, pulse 81, temperature 98.6 F (37 C), temperature source Oral, resp. rate 20, height 5\' 6"  (1.676 m), weight 120.2 kg, last menstrual period  11/16/2018, SpO2 100 %, unknown if currently breastfeeding.    Vitals reviewed. Constitutional: She is oriented to person, place, and time. She appears well-developed and well-nourished.  HENT:  Head: Normocephalic and atraumatic.  Right Ear: External ear normal.  Left Ear: External ear normal.  Nose: Nose normal.  Mouth/Throat: Oropharynx is clear and moist.  Eyes: Conjunctivae and EOM are normal. Pupils are equal, round, and reactive to light. Right eye exhibits no discharge. Left eye exhibits no  discharge. No scleral icterus.  Neck: Normal range of motion. Neck supple. No tracheal deviation present. No thyromegaly present.  Cardiovascular: Normal rate, regular rhythm, normal heart sounds and intact distal pulses.  Exam reveals no gallop and no friction rub.   No murmur heard. Respiratory: Effort normal and breath sounds normal. No respiratory distress. She has no wheezes. She has no rales. She exhibits no tenderness.  GI: Soft. Bowel sounds are normal. She exhibits no distension and no mass. There is tenderness. There is no rebound and no guarding.  Genitourinary:       Vulva is normal without lesions Pelvic exam per sonogram  Musculoskeletal: Normal range of motion. She exhibits no edema and no tenderness.  Neurological: She is alert and oriented to person, place, and time. She has normal reflexes. She displays normal reflexes. No cranial nerve deficit. She exhibits normal muscle tone. Coordination normal.  Skin: Skin is warm and dry. No rash noted. No erythema. No pallor.  Psychiatric: She has a normal mood and affect. Her behavior is normal. Judgment and thought content normal.    Labs: Results for orders placed or performed during the hospital encounter of 12/17/18 (from the past 336 hour(s))  CBC with Differential/Platelet   Collection Time: 12/17/18  2:05 AM  Result Value Ref Range   WBC 11.8 (H) 4.0 - 10.5 K/uL   RBC 3.67 (L) 3.87 - 5.11 MIL/uL   Hemoglobin 10.3 (L) 12.0 - 15.0 g/dL   HCT 78.231.6 (L) 95.636.0 - 21.346.0 %   MCV 86.1 80.0 - 100.0 fL   MCH 28.1 26.0 - 34.0 pg   MCHC 32.6 30.0 - 36.0 g/dL   RDW 08.614.3 57.811.5 - 46.915.5 %   Platelets 222 150 - 400 K/uL   nRBC 0.0 0.0 - 0.2 %   Neutrophils Relative % 90 %   Neutro Abs 10.5 (H) 1.7 - 7.7 K/uL   Lymphocytes Relative 6 %   Lymphs Abs 0.8 0.7 - 4.0 K/uL   Monocytes Relative 4 %   Monocytes Absolute 0.5 0.1 - 1.0 K/uL   Eosinophils Relative 0 %   Eosinophils Absolute 0.0 0.0 - 0.5 K/uL   Basophils Relative 0 %   Basophils  Absolute 0.0 0.0 - 0.1 K/uL   Immature Granulocytes 0 %   Abs Immature Granulocytes 0.02 0.00 - 0.07 K/uL  hCG, quantitative, pregnancy   Collection Time: 12/17/18  2:05 AM  Result Value Ref Range   hCG, Beta Chain, Quant, S 1,650 (H) <5 mIU/mL  Type and screen   Collection Time: 12/17/18  2:05 AM  Result Value Ref Range   ABO/RH(D) O POS    Antibody Screen NEG    Sample Expiration      12/20/2018,2359 Performed at [facility], [address] [place], [place] [id number]   ABO/Rh   Collection Time: 12/17/18  2:05 AM  Result Value Ref Range   ABO/RH(D)      O POS Performed at [facility], [address] [place],  [place] [address]     EKG: No orders found for this or any previous visit.  Imaging Studies: US Ob Comp Less 14 Wks  Result Date: 12/17/2018 CLINICAL DATA:  Known right ectopic pregnancy. Head methotrexate twice. Increasing pelvic pain, question rupture. EXAM: OBSTETRIC <14 WK ULTRASOUND TECHNIQUE: Transabdominal ultrasound was performed for evaluation of the gestation as well as the maternal uterus and adnexal regions. COMPARISON:  None. FINDINGS: Intrauterine gestational sac: None Yolk sac:  Not Visualized. Embryo:  Not Visualized. Maternal uterus/adnexae: The uterus is retroverted. No intrauterine gestational sac or fluid. Endometrium measures approximately 7 mm. Heterogeneous complex masslike structure measuring 3.4 x 3.4 x 3.1 cm cephalad to the uterine fundus. Large amount of complex fluid in blood clot throughout the pelvis. Fluid tracks in the right upper quadrant inferior to the liver. Neither ovary was discretely visualized. IMPRESSION: Large amount of complex fluid in the pelvis consistent with rupture of known ectopic pregnancy. Complex cystic structure adjacent to the uterine fundus measuring 3.4 cm likely represents the known ectopic. Critical Value/emergent results were called by telephone at the time of interpretation on 12/17/2018 at 2:45 am to [facility] , who verbally acknowledged these results. Electronically Signed   By: Narda Rutherford M.D.   On: 12/17/2018 02:45      Assessment: Ruptured right ectopic pregnancy after 2 doses of methotrexate  Patient Active Problem List   Diagnosis Date Noted  . History of anemia 01/01/2018  . Postpartum care following vaginal delivery 3/29 11/01/2016    Plan: Laparoscopic management of ruptured ectopic pregnancy, reported to be on the right, with hemoperitoneum, possible salpingectomy  Lazaro Arms 12/17/2018 4:29 AM

## 2018-12-17 NOTE — MAU Note (Signed)
Pt here with known ectopic pregnancy; was given Methotrexate on Wednesday of last week, then again this Tuesday; pain worse tonight starting about about 2030. Took Tylenol and Motrin with no relief. Having some spotting.

## 2018-12-17 NOTE — Anesthesia Preprocedure Evaluation (Addendum)
Anesthesia Evaluation  Patient identified by MRN, date of birth, ID band Patient awake    Reviewed: Allergy & Precautions, H&P , NPO status , Patient's Chart, lab work & pertinent test results  Airway Mallampati: II  TM Distance: >3 FB Neck ROM: Full    Dental no notable dental hx. (+) Teeth Intact, Dental Advisory Given   Pulmonary neg pulmonary ROS,    Pulmonary exam normal breath sounds clear to auscultation       Cardiovascular negative cardio ROS   Rhythm:Regular Rate:Normal     Neuro/Psych negative neurological ROS  negative psych ROS   GI/Hepatic negative GI ROS, Neg liver ROS,   Endo/Other  Morbid obesity  Renal/GU negative Renal ROS  negative genitourinary   Musculoskeletal   Abdominal   Peds  Hematology  (+) Blood dyscrasia, anemia ,   Anesthesia Other Findings   Reproductive/Obstetrics negative OB ROS                             Anesthesia Physical Anesthesia Plan  ASA: II and emergent  Anesthesia Plan: General   Post-op Pain Management:    Induction: Intravenous, Rapid sequence and Cricoid pressure planned  PONV Risk Score and Plan: 4 or greater and Ondansetron, Dexamethasone and Midazolam  Airway Management Planned: Oral ETT  Additional Equipment:   Intra-op Plan:   Post-operative Plan: Extubation in OR  Informed Consent: I have reviewed the patients History and Physical, chart, labs and discussed the procedure including the risks, benefits and alternatives for the proposed anesthesia with the patient or authorized representative who has indicated his/her understanding and acceptance.     Dental advisory given  Plan Discussed with: CRNA  Anesthesia Plan Comments:         Anesthesia Quick Evaluation

## 2018-12-18 ENCOUNTER — Encounter (HOSPITAL_COMMUNITY): Payer: Self-pay | Admitting: Obstetrics & Gynecology

## 2018-12-21 ENCOUNTER — Telehealth: Payer: Self-pay | Admitting: Obstetrics & Gynecology

## 2018-12-21 ENCOUNTER — Encounter: Payer: Self-pay | Admitting: Obstetrics & Gynecology

## 2018-12-21 NOTE — Telephone Encounter (Signed)
Pt is needing a note to be out of work for a week after having surgery on 12/17/2018. Also she would like a call to see how long she should not be lifting.

## 2018-12-21 NOTE — Telephone Encounter (Signed)
Pt aware letter is done and 2 weeks no greater than 10 lbs. Pt voiced understanding. JSY

## 2018-12-21 NOTE — Telephone Encounter (Signed)
Letter is done she should be able to access it thru MyChart  And 2 weeks no greater than 10 pounds

## 2018-12-24 ENCOUNTER — Other Ambulatory Visit: Payer: Self-pay

## 2018-12-24 ENCOUNTER — Ambulatory Visit (INDEPENDENT_AMBULATORY_CARE_PROVIDER_SITE_OTHER): Payer: 59 | Admitting: Family Medicine

## 2018-12-24 ENCOUNTER — Encounter: Payer: Self-pay | Admitting: Family Medicine

## 2018-12-24 DIAGNOSIS — F4321 Adjustment disorder with depressed mood: Secondary | ICD-10-CM

## 2018-12-24 NOTE — Progress Notes (Signed)
Virtual Visit via Video Note  I connected with Cynthia Mckee on 12/24/18 at  2:30 PM EDT by a video enabled telemedicine application and verified that I am speaking with the correct person using two identifiers.  Location patient: home Location provider:work or home office Persons participating in the virtual visit: patient, provider  I discussed the limitations of evaluation and management by telemedicine and the availability of in person appointments. The patient expressed understanding and agreed to proceed.   HPI: Pt is a 33 yo female [id number] with pmh sig for Mckee/o anemia and Mckee/o miscarriage.    Pt had positive pregnancy test mid April. Followed by fertility specialist, Dr. April MansonYalcinkaya for Mckee/o multiple SAB.  Per pt labs/ u/s indicated ectopic.  She received Methotrexate x 2.  Pt went to ED 5/14 after having abdominal pain.  U/S with hemoperitoneum 2/2 ruptured ectopic pregnancy, operative management advised.  Pt s/p diagnostic lap with removal of ectopic pregnancy and R salpingectomy.    Pt has been doing well physically since surgery, but is concerned she is not emotionally ready to go back to work.  Pt is a LCSW for 3 clinics.  Pt states she is there by herself and not sure she is will be able to give her clients all that they need.  Pt's short term disability is over on Sunday 5/24.  Pt has a good support system (mom, husband, sisters).  Endorses difficulty sleeping, tearful at times. Pt denies n/v, pain.     ROS: See pertinent positives and negatives per HPI.  Past Medical History:  Diagnosis Date  . Anemia    one year ago, last check was normal    Past Surgical History:  Procedure Laterality Date  . DIAGNOSTIC LAPAROSCOPY WITH REMOVAL OF ECTOPIC PREGNANCY N/A 12/17/2018   Procedure: DIAGNOSTIC LAPAROSCOPY WITH REMOVAL OF ECTOPIC PREGNANCY, RIGHT SALPINGECTOMY;  Surgeon: Cynthia Mckee, Cynthia H, MD;  Location: [facility];  Service: Gynecology;  Laterality: N/A;  . DILATATION &  CURRETTAGE/HYSTEROSCOPY WITH RESECTOCOPE N/A 03/16/2015   Procedure: DILATATION & CURETTAGE/HYSTEROSCOPY WITH RESECTOCOPE;  Surgeon: Cynthia BetterSheronette Cousins, MD;  Location: [facility];  Service: Gynecology;  Laterality: N/A;  . miscarriage    . WISDOM TOOTH EXTRACTION  2011    Family History  Problem Relation Age of Onset  . Hypertension Mother   . Cancer Sister        osteoscarcoma at age 32.  in remission x 10 yrs  . Early death Sister   . Miscarriages / Stillbirths Sister     SOCIAL HX:    Current Outpatient Medications:  .  acetaminophen (TYLENOL) 500 MG tablet, Take 1,000 mg by mouth every 6 (six) hours as needed., Disp: , Rfl:  .  cyclobenzaprine (FLEXERIL) 5 MG tablet, Take 1 tablet (5 mg total) by mouth 3 (three) times daily as needed for muscle spasms., Disp: 30 tablet, Rfl: 1 .  HYDROcodone-acetaminophen (NORCO/VICODIN) 5-325 MG tablet, Take 1 tablet by mouth every 6 (six) hours as needed., Disp: 15 tablet, Rfl: 0 .  ibuprofen (ADVIL,MOTRIN) 600 MG tablet, Take 1 tablet (600 mg total) by mouth every 6 (six) hours., Disp: 30 tablet, Rfl: 0 .  ketorolac (TORADOL) 10 MG tablet, Take 1 tablet (10 mg total) by mouth every 8 (eight) hours as needed., Disp: 15 tablet, Rfl: 0 .  ondansetron (ZOFRAN ODT) 8 MG disintegrating tablet, Take 1 tablet (8 mg total) by mouth every 8 (eight) hours as needed for nausea or vomiting., Disp: 20 tablet, Rfl: 0 .  Prenatal Vit w/Fe-Methylfol-FA (PNV PO), Take by mouth., Disp: , Rfl:   EXAM:  VITALS per patient if applicable:  RR between 12-20 bpm  GENERAL: alert, oriented, appears well and in no acute distress  HEENT: atraumatic, conjunctiva clear, no obvious abnormalities on inspection of external nose and ears  NECK: normal movements of the head and neck  LUNGS: on inspection no signs of respiratory distress, breathing rate appears normal, no obvious gross SOB, gasping or wheezing  CV: no obvious cyanosis  MS: moves all visible extremities without  noticeable abnormality  PSYCH/NEURO: pleasant and cooperative, no obvious depression or anxiety, speech and thought processing grossly intact  ASSESSMENT AND PLAN:  Discussed the following assessment and plan:  Grief  -pt with Mckee/o multiple SABs, s/p diagnostic lap and salpingectomy for ectopic pregnangy -pt has a good support system -Pt strongly advised to consider counseling.  Discussed various options and advised referral is not needed. -Given numerous stressors pt advised a brief leave from work may be beneficial.    F/u prn   I discussed the assessment and treatment plan with the patient. The patient was provided an opportunity to ask questions and all were answered. The patient agreed with the plan and demonstrated an understanding of the instructions.   The patient was advised to call back or seek an in-person evaluation if the symptoms worsen or if the condition fails to improve as anticipated.   Cynthia Saint, MD

## 2019-01-04 ENCOUNTER — Telehealth: Payer: Self-pay

## 2019-01-04 NOTE — Telephone Encounter (Signed)
Pt Family and Medical Leave form was faxed to the office on the 12/29/2018. Form has been completed by Dr Salomon Fick and faxed to the fax requested. Pt aware and states that she received a call stating that the fax was received. A copy sent to scanning.

## 2019-01-05 ENCOUNTER — Telehealth: Payer: Self-pay

## 2019-01-05 NOTE — Telephone Encounter (Signed)
CRM is resolved   Copied from CRM [id number]. Topic: General - Other >> Dec 30, 2018  9:09 AM Angela Nevin wrote: Reason for CRM: Patient calling to inquire if office has received FMLA paperwork that was faxed yesterday. Please advise.

## 2019-01-07 ENCOUNTER — Encounter: Payer: Self-pay | Admitting: *Deleted

## 2019-01-19 ENCOUNTER — Telehealth: Payer: Self-pay | Admitting: Family Medicine

## 2019-01-19 NOTE — Telephone Encounter (Signed)
Fitness-for-Duty / Return to Work Release to be filled out; placed in Teaching laboratory technician.  Call [phone] upon completion.

## 2019-01-21 NOTE — Telephone Encounter (Signed)
Spoke with pt aware that her return to work release form is ready for pick up in the office, pt aware to call the office extension when she arrives at our [employer]

## 2019-01-21 NOTE — Telephone Encounter (Signed)
Pt picked up the form from the office on 01/21/2019 at 2.30 pm

## 2019-02-01 ENCOUNTER — Ambulatory Visit: Payer: 59 | Admitting: Obstetrics & Gynecology

## 2019-12-01 ENCOUNTER — Other Ambulatory Visit: Payer: Self-pay

## 2019-12-01 ENCOUNTER — Encounter: Payer: Self-pay | Admitting: Family Medicine

## 2019-12-01 ENCOUNTER — Other Ambulatory Visit (HOSPITAL_COMMUNITY)
Admission: RE | Admit: 2019-12-01 | Discharge: 2019-12-01 | Disposition: A | Discharge status: Home / Self Care | Payer: 59 | Source: Ambulatory Visit | Attending: Family Medicine | Admitting: Family Medicine

## 2019-12-01 ENCOUNTER — Ambulatory Visit (INDEPENDENT_AMBULATORY_CARE_PROVIDER_SITE_OTHER): Payer: 59 | Admitting: Family Medicine

## 2019-12-01 VITALS — BP 118/78 | HR 72 | Temp 97.9°F | Wt 263.0 lb

## 2019-12-01 DIAGNOSIS — Z13220 Encounter for screening for lipoid disorders: Secondary | ICD-10-CM

## 2019-12-01 DIAGNOSIS — Z131 Encounter for screening for diabetes mellitus: Secondary | ICD-10-CM | POA: Diagnosis not present

## 2019-12-01 DIAGNOSIS — Z6841 Body Mass Index (BMI) 40.0 and over, adult: Secondary | ICD-10-CM | POA: Diagnosis not present

## 2019-12-01 DIAGNOSIS — Z0000 Encounter for general adult medical examination without abnormal findings: Secondary | ICD-10-CM | POA: Diagnosis not present

## 2019-12-01 DIAGNOSIS — Z124 Encounter for screening for malignant neoplasm of cervix: Secondary | ICD-10-CM

## 2019-12-01 DIAGNOSIS — M67471 Ganglion, right ankle and foot: Secondary | ICD-10-CM | POA: Diagnosis not present

## 2019-12-01 LAB — T4, FREE: Free T4: 0.78 ng/dL (ref 0.60–1.60)

## 2019-12-01 LAB — BASIC METABOLIC PANEL
BUN: 10 mg/dL (ref 6–23)
CO2: 30 mEq/L (ref 19–32)
Calcium: 9.3 mg/dL (ref 8.4–10.5)
Chloride: 101 mEq/L (ref 96–112)
Creatinine, Ser: 0.63 mg/dL (ref 0.40–1.20)
GFR: 131.95 mL/min (ref >60.00)
Glucose, Bld: 87 mg/dL (ref 70–99)
Potassium: 4.5 mEq/L (ref 3.5–5.1)
Sodium: 136 mEq/L (ref 135–145)

## 2019-12-01 LAB — LIPID PANEL
Cholesterol: 190 mg/dL (ref 0–200)
HDL: 47.6 mg/dL (ref >39.00)
LDL Cholesterol: 131 mg/dL — ABNORMAL HIGH (ref 0–99)
NonHDL: 142.82
Total CHOL/HDL Ratio: 4
Triglycerides: 60 mg/dL (ref 0.0–149.0)
VLDL: 12 mg/dL (ref 0.0–40.0)

## 2019-12-01 LAB — TSH: TSH: 2.27 u[IU]/mL (ref 0.35–4.50)

## 2019-12-01 LAB — HEMOGLOBIN A1C: Hgb A1c MFr Bld: 5.6 % (ref 4.6–6.5)

## 2019-12-01 NOTE — Progress Notes (Signed)
Subjective:     Cynthia Mckee is a 34 y.o. female and is here for a comprehensive physical exam. The patient reports problems - bump on R foot.  Pt notes bump on right foot x2 years.  Initially painful when working out.  Not currently painful.  Has grown in size.  Patient has been staying busy keeping up with her regular-year-old daughter.  Otherwise patient without issue.  Last Pap done 3 years ago.  Denies any issues.  Social History   Socioeconomic History  . Marital status: Married    Spouse name: Not on file  . Number of children: Not on file  . Years of education: Not on file  . Highest education level: Not on file  Occupational History  . Not on file  Tobacco Use  . Smoking status: Never Smoker  . Smokeless tobacco: Never Used  Substance and Sexual Activity  . Alcohol use: No  . Drug use: No  . Sexual activity: Yes    Birth control/protection: None  Other Topics Concern  . Not on file  Social History Narrative  . Not on file   Social Determinants of Health   Financial Resource Strain:   . Difficulty of Paying Living Expenses:   Food Insecurity:   . Worried About Charity fundraiser in the Last Year:   . Arboriculturist in the Last Year:   Transportation Needs:   . Film/video editor (Medical):   Marland Kitchen Lack of Transportation (Non-Medical):   Physical Activity:   . Days of Exercise per Week:   . Minutes of Exercise per Session:   Stress:   . Feeling of Stress :   Social Connections:   . Frequency of Communication with Friends and Family:   . Frequency of Social Gatherings with Friends and Family:   . Attends Religious Services:   . Active Member of Clubs or Organizations:   . Attends Archivist Meetings:   Marland Kitchen Marital Status:   Intimate Partner Violence:   . Fear of Current or Ex-Partner:   . Emotionally Abused:   Marland Kitchen Physically Abused:   . Sexually Abused:    Health Maintenance  Topic Date Due  . COVID-19 Vaccine (1) Never done  .  PAP SMEAR-Modifier  Never done  . INFLUENZA VACCINE  03/05/2020  . TETANUS/TDAP  09/09/2026  . HIV Screening  Completed    The following portions of the patient's history were reviewed and updated as appropriate: allergies, current medications, past family history, past medical history, past social history, past surgical history and problem list.  Review of Systems Pertinent items noted in HPI and remainder of comprehensive ROS otherwise negative.   Objective:    BP 118/78 (BP Location: Left Arm, Patient Position: Sitting, Cuff Size: Large)   Pulse 72   Temp 97.9 F (36.6 C) (Temporal)   Wt 263 lb (119.3 kg)   SpO2 98%   BMI 42.45 kg/m  General appearance: alert, cooperative and no distress Head: Normocephalic, without obvious abnormality, atraumatic Eyes: conjunctivae/corneas clear. PERRL, EOM's intact. Fundi benign. Ears: normal TM's and external ear canals both ears Nose: Nares normal. Septum midline. Mucosa normal. No drainage or sinus tenderness. Throat: lips, mucosa, and tongue normal; teeth and gums normal Neck: no adenopathy, no carotid bruit, no JVD, supple, symmetrical, trachea midline and thyroid not enlarged, symmetric, no tenderness/mass/nodules Lungs: clear to auscultation bilaterally Heart: regular rate and rhythm, S1, S2 normal, no murmur, click, rub or gallop Abdomen: soft,  non-tender; bowel sounds normal; no masses,  no organomegaly Pelvic: cervix normal in appearance, external genitalia normal, no adnexal masses or tenderness, no cervical motion tenderness, perianal skin: no external genital warts noted, rectovaginal septum normal, uterus normal size, shape, and consistency and vagina normal without discharge Pap collected. Extremities: extremities normal, atraumatic, no cyanosis or edema Pulses: 2+ and symmetric Skin: Warm, dry, intact, rash.  Dorsum of right foot with round, 1.5 cm firm, mobile cyst proximal to ankle.  Lymph nodes: Cervical, supraclavicular,  and axillary nodes normal. Neurologic: Alert and oriented X 3, normal strength and tone. Normal symmetric reflexes. Normal coordination and gait    Assessment:    Healthy female exam with cyst of R foot.      Plan:     Anticipatory guidance given including wearing seatbelts, smoke detectors in the home, increasing physical activity, increasing p.o. intake of water and vegetables. -Labs obtained this visit -Pap done this visit. -Given handout -Next CPE in 1 year See After Visit Summary for Counseling Recommendations   Cervical cancer screening  - Plan: PAP [[place]]  Screening for cholesterol level  - Plan: Lipid panel  Screening for diabetes mellitus  - Plan: Hemoglobin A1c  Ganglion cyst of right foot -Discussed treatment options.  Advised cyst may return. -Given handout - Plan: Ambulatory referral to Podiatry  Class 3 severe obesity due to excess calories without serious comorbidity with body mass index (BMI) of 40.0 to 44.9 in adult [facility]) -Lifestyle modifications encouraged - Plan: TSH, T4, Free, Hemoglobin A1c, Lipid panel  Follow-up as needed  Abbe Amsterdam, MD

## 2019-12-01 NOTE — Patient Instructions (Signed)
Preventive Care 21-33 Years Old, Female Preventive care refers to visits with your health care provider and lifestyle choices that can promote health and wellness. This includes:  A yearly physical exam. This may also be called an annual well check.  Regular dental visits and eye exams.  Immunizations.  Screening for certain conditions.  Healthy lifestyle choices, such as eating a healthy diet, getting regular exercise, not using drugs or products that contain nicotine and tobacco, and limiting alcohol use. What can I expect for my preventive care visit? Physical exam Your health care provider will check your:  Height and weight. This may be used to calculate body mass index (BMI), which tells if you are at a healthy weight.  Heart rate and blood pressure.  Skin for abnormal spots. Counseling Your health care provider may ask you questions about your:  Alcohol, tobacco, and drug use.  Emotional well-being.  Home and relationship well-being.  Sexual activity.  Eating habits.  Work and work environment.  Method of birth control.  Menstrual cycle.  Pregnancy history. What immunizations do I need?  Influenza (flu) vaccine  This is recommended every year. Tetanus, diphtheria, and pertussis (Tdap) vaccine  You may need a Td booster every 10 years. Varicella (chickenpox) vaccine  You may need this if you have not been vaccinated. Human papillomavirus (HPV) vaccine  If recommended by your health care provider, you may need three doses over 6 months. Measles, mumps, and rubella (MMR) vaccine  You may need at least one dose of MMR. You may also need a second dose. Meningococcal conjugate (MenACWY) vaccine  One dose is recommended if you are age 19-21 years and a first-year college student living in a residence hall, or if you have one of several medical conditions. You may also need additional booster doses. Pneumococcal conjugate (PCV13) vaccine  You may need  this if you have certain conditions and were not previously vaccinated. Pneumococcal polysaccharide (PPSV23) vaccine  You may need one or two doses if you smoke cigarettes or if you have certain conditions. Hepatitis A vaccine  You may need this if you have certain conditions or if you travel or work in places where you may be exposed to hepatitis A. Hepatitis B vaccine  You may need this if you have certain conditions or if you travel or work in places where you may be exposed to hepatitis B. Haemophilus influenzae type b (Hib) vaccine  You may need this if you have certain conditions. You may receive vaccines as individual doses or as more than one vaccine together in one shot (combination vaccines). Talk with your health care provider about the risks and benefits of combination vaccines. What tests do I need?  Blood tests  Lipid and cholesterol levels. These may be checked every 5 years starting at age 20.  Hepatitis C test.  Hepatitis B test. Screening  Diabetes screening. This is done by checking your blood sugar (glucose) after you have not eaten for a while (fasting).  Sexually transmitted disease (STD) testing.  BRCA-related cancer screening. This may be done if you have a family history of breast, ovarian, tubal, or peritoneal cancers.  Pelvic exam and Pap test. This may be done every 3 years starting at age 21. Starting at age 30, this may be done every 5 years if you have a Pap test in combination with an HPV test. Talk with your health care provider about your test results, treatment options, and if necessary, the need for more tests.   Follow these instructions at home: Eating and drinking   Eat a diet that includes fresh fruits and vegetables, whole grains, lean protein, and low-fat dairy.  Take vitamin and mineral supplements as recommended by your health care provider.  Do not drink alcohol if: ? Your health care provider tells you not to drink. ? You are  pregnant, may be pregnant, or are planning to become pregnant.  If you drink alcohol: ? Limit how much you have to 0-1 drink a day. ? Be aware of how much alcohol is in your drink. In the [place] one drink equals one 12 oz bottle of beer (355 mL), one 5 oz glass of wine (148 mL), or one 1 oz glass of hard liquor (44 mL). Lifestyle  Take daily care of your teeth and gums.  Stay active. Exercise for at least 30 minutes on 5 or more days each week.  Do not use any products that contain nicotine or tobacco, such as cigarettes, e-cigarettes, and chewing tobacco. If you need help quitting, ask your health care provider.  If you are sexually active, practice safe sex. Use a condom or other form of birth control (contraception) in order to prevent pregnancy and STIs (sexually transmitted infections). If you plan to become pregnant, see your health care provider for a preconception visit. What's next?  Visit your health care provider once a year for a well check visit.  Ask your health care provider how often you should have your eyes and teeth checked.  Stay up to date on all vaccines. This information is not intended to replace advice given to you by your health care provider. Make sure you discuss any questions you have with your health care provider. Document Revised: 04/02/2018 Document Reviewed: 04/02/2018 Elsevier Patient Education  [place].  Ganglion Cyst  A ganglion cyst is a non-cancerous, fluid-filled lump that occurs near a joint or tendon. The cyst grows out of a joint or the lining of a tendon. Ganglion cysts most often develop in the hand or wrist, but they can also develop in the shoulder, elbow, hip, knee, ankle, or foot. Ganglion cysts are ball-shaped or egg-shaped. Their size can range from the size of a pea to larger than a grape. Increased activity may cause the cyst to get bigger because more fluid starts to build up. What are the causes? The exact cause of this  condition is not known, but it may be related to:  Inflammation or irritation around the joint.  An injury.  Repetitive movements or overuse.  Arthritis. What increases the risk? You are more likely to develop this condition if:  You are a woman.  You are 8-32 years old. What are the signs or symptoms? The main symptom of this condition is a lump. It most often appears on the hand or wrist. In many cases, there are no other symptoms, but a cyst can sometimes cause:  Tingling.  Pain.  Numbness.  Muscle weakness.  Weak grip.  Less range of motion in a joint. How is this diagnosed? Ganglion cysts are usually diagnosed based on a physical exam. Your health care provider will feel the lump and may shine a light next to it. If it is a ganglion cyst, the light will likely shine through it. Your health care provider may order an X-ray, ultrasound, or MRI to rule out other conditions. How is this treated? Ganglion cysts often go away on their own without treatment. If you have pain or other symptoms,  treatment may be needed. Treatment is also needed if the ganglion cyst limits your movement or if it gets infected. Treatment may include:  Wearing a brace or splint on your wrist or finger.  Taking anti-inflammatory medicine.  Having fluid drained from the lump with a needle (aspiration).  Getting a steroid injected into the joint.  Having surgery to remove the ganglion cyst.  Placing a pad on your shoe or wearing shoes that will not rub against the cyst if it is on your foot. Follow these instructions at home:  Do not press on the ganglion cyst, poke it with a needle, or hit it.  Take over-the-counter and prescription medicines only as told by your health care provider.  If you have a brace or splint: ? Wear it as told by your health care provider. ? Remove it as told by your health care provider. Ask if you need to remove it when you take a shower or a bath.  Watch your  ganglion cyst for any changes.  Keep all follow-up visits as told by your health care provider. This is important. Contact a health care provider if:  Your ganglion cyst becomes larger or more painful.  You have pus coming from the lump.  You have weakness or numbness in the affected area.  You have a fever or chills. Get help right away if:  You have a fever and have any of these in the cyst area: ? Increased redness. ? Red streaks. ? Swelling. Summary  A ganglion cyst is a non-cancerous, fluid-filled lump that occurs near a joint or tendon.  Ganglion cysts most often develop in the hand or wrist, but they can also develop in the shoulder, elbow, hip, knee, ankle, or foot.  Ganglion cysts often go away on their own without treatment. This information is not intended to replace advice given to you by your health care provider. Make sure you discuss any questions you have with your health care provider. Document Revised: 07/04/2017 Document Reviewed: 03/21/2017 Elsevier Patient Education  [place].

## 2019-12-02 LAB — CYTOLOGY - PAP
Comment: NEGATIVE
Diagnosis: NEGATIVE
High risk HPV: NEGATIVE

## 2019-12-02 LAB — CBC WITH DIFFERENTIAL/PLATELET
Basophils Absolute: 0 10*3/uL (ref 0.0–0.1)
Basophils Relative: 0.4 % (ref 0.0–3.0)
Eosinophils Absolute: 0.1 10*3/uL (ref 0.0–0.7)
Eosinophils Relative: 1.2 % (ref 0.0–5.0)
HCT: 37.8 % (ref 36.0–46.0)
Hemoglobin: 12.3 g/dL (ref 12.0–15.0)
Lymphocytes Relative: 23.5 % (ref 12.0–46.0)
Lymphs Abs: 1.5 10*3/uL (ref 0.7–4.0)
MCHC: 32.4 g/dL (ref 30.0–36.0)
MCV: 86 fl (ref 78.0–100.0)
Monocytes Absolute: 0.4 10*3/uL (ref 0.1–1.0)
Monocytes Relative: 6.4 % (ref 3.0–12.0)
Neutro Abs: 4.3 10*3/uL (ref 1.4–7.7)
Neutrophils Relative %: 68.5 % (ref 43.0–77.0)
Platelets: 252 10*3/uL (ref 150.0–400.0)
RBC: 4.39 Mil/uL (ref 3.87–5.11)
RDW: 14.1 % (ref 11.5–15.5)
WBC: 6.2 10*3/uL (ref 4.0–10.5)

## 2020-02-04 ENCOUNTER — Ambulatory Visit (INDEPENDENT_AMBULATORY_CARE_PROVIDER_SITE_OTHER): Payer: 59 | Admitting: Podiatry

## 2020-02-04 ENCOUNTER — Encounter: Payer: Self-pay | Admitting: Podiatry

## 2020-02-04 ENCOUNTER — Other Ambulatory Visit: Payer: Self-pay

## 2020-02-04 ENCOUNTER — Ambulatory Visit (INDEPENDENT_AMBULATORY_CARE_PROVIDER_SITE_OTHER): Payer: 59

## 2020-02-04 DIAGNOSIS — M67471 Ganglion, right ankle and foot: Secondary | ICD-10-CM

## 2020-02-04 MED ORDER — LIDOCAINE-PRILOCAINE 2.5-2.5 % EX CREA
1.0000 | TOPICAL_CREAM | CUTANEOUS | 0 refills | Status: DC | PRN
Start: 2020-02-04 — End: 2023-03-20

## 2020-02-04 NOTE — Progress Notes (Signed)
  Subjective:  Patient ID: Cynthia Mckee, female    DOB: [birth year],  MRN: [id number]  Chief Complaint  Patient presents with  . Ganglion Cyst    right foot know     34 y.o. female presents with the above complaint. History confirmed with patient.  Soft mobile mass present for about 3 or 4 years.  Has pain on and off.  Thinks it is getting a bit bigger and cause pressure on shoe gear.  No treatment thus far.  She works here, as an Estate manager/land agent.  Objective:  Physical Exam: warm, good capillary refill, no trophic changes or ulcerative lesions, normal DP and PT pulses and normal sensory exam. Left Foot: normal exam, no swelling, tenderness, instability; ligaments intact, full range of motion of all ankle/foot joints  Right Foot: Palpable mass just distal to the ankle joint line adjacent to the extensor digitorum longus tendon bundle, soft and fluctuant, mobile   No images are attached to the encounter.  Radiographs: X-ray of the right foot: no fracture, dislocation, swelling or degenerative changes noted and No calcifications or mass noted in the region of interest. Assessment:   1. Ganglion cyst of right foot      Plan:  Patient was evaluated and treated and all questions answered.  Ganglion cyst Discussed the etiology and possible treatment options for ganglion cyst, including injection/aspiration and administration of steroid, surgical excision, relieving the mass as is and monitoring and treating symptomatic effects of this.  She elected for aspiration and injection of steroid.  Was unable to express or aspirate any of the cyst today.  No steroid was injected.  See procedure note below.  We discussed eventual surgical excision, and she will consider this becomes more symptomatic.  In the interim I sent her a prescription for lidocaine prilocaine cream for topical anesthetic relief.  Procedure note: Following verbal consent, the right dorsal foot was anesthetized in a V-block  fashion just proximal to the block using 1.5 cc of 0.5% Marcaine plain and 1.5 cc of 2% Xylocaine plain in a 50-50 mix.  The skin was then prepared with Betadine solution in an aseptic technique.  An 18-gauge needle on a 10 cc syringe was then used to attempt to aspirate the cyst in question.  No fluid was able to be aspirated I then tried it again from a second approach, again this was unsuccessful.  I was unable to express any of the cyst material through the aspiration holes.  We then elected to abandon the procedure.  Return if symptoms worsen or fail to improve, or to schedule surgery.

## 2020-04-06 ENCOUNTER — Ambulatory Visit: Payer: 59 | Attending: Critical Care Medicine

## 2020-04-06 ENCOUNTER — Ambulatory Visit: Payer: 59 | Admitting: *Deleted

## 2020-04-06 DIAGNOSIS — Z23 Encounter for immunization: Secondary | ICD-10-CM

## 2020-04-06 NOTE — Progress Notes (Signed)
° °  Covid-19 Vaccination Clinic  Name:  Cynthia Mckee    MRN: [id number] DOB: 1986-02-16  04/06/2020  [name] was observed post Covid-19 immunization for 15 minutes without incident. She was provided with Vaccine Information Sheet and instruction to access the V-Safe system.   Ms. Cheeks was instructed to call 911 with any severe reactions post vaccine:  Difficulty breathing   Swelling of face and throat   A fast heartbeat   A bad rash all over body   Dizziness and weakness   Immunizations Administered    Name Date Dose VIS Date Route   Moderna COVID-19 Vaccine 04/06/2020  5:20 PM 0.5 mL 07/2019 Intramuscular   Manufacturer: Moderna   Lot: [id number]   NDC: 16579-038-33

## 2020-04-06 NOTE — Progress Notes (Signed)
Patient tolerated injection well today 

## 2020-05-08 ENCOUNTER — Ambulatory Visit: Payer: 59 | Attending: Critical Care Medicine

## 2020-05-08 ENCOUNTER — Ambulatory Visit: Payer: 59 | Admitting: *Deleted

## 2020-05-08 DIAGNOSIS — Z23 Encounter for immunization: Secondary | ICD-10-CM

## 2020-05-08 NOTE — Progress Notes (Signed)
Patient tolerated vaccine well today. Patient received a bottle of water and was observed with no concerns.

## 2020-05-08 NOTE — Progress Notes (Signed)
   Covid-19 Vaccination Clinic  Name:  Cynthia Mckee    MRN: [id number] DOB: Feb 27, 1986  05/08/2020  [name] was observed post Covid-19 immunization for 15 minutes  without incident. She was provided with Vaccine Information Sheet and instruction to access the V-Safe system.   [name] was instructed to call 911 with any severe reactions post vaccine: Marland Kitchen Difficulty breathing  . Swelling of face and throat  . A fast heartbeat  . A bad rash all over body  . Dizziness and weakness   Immunizations Administered    Name Date Dose VIS Date Route   Moderna COVID-19 Vaccine 05/08/2020  5:00 PM 0.5 mL 07/2019 Intramuscular   Manufacturer: Moderna   Lot: [id number]   NDC: 92330-076-22

## 2020-12-01 ENCOUNTER — Telehealth: Payer: Self-pay | Admitting: Family Medicine

## 2020-12-01 NOTE — Telephone Encounter (Signed)
Patient called the after hours line to reschedule her appointment.  LMVM to contact the office to reschedule her appointment.

## 2020-12-04 ENCOUNTER — Encounter: Payer: 59 | Admitting: Family Medicine

## 2021-01-08 ENCOUNTER — Encounter: Payer: 59 | Admitting: Family Medicine

## 2021-01-09 IMAGING — US OBSTETRIC <14 WK ULTRASOUND
1 series · 15 of 28 positions shown · non-contrast
Comparison: None.

CLINICAL DATA: Known right ectopic pregnancy. Head methotrexate
twice. Increasing pelvic pain, question rupture.

EXAM:
OBSTETRIC <14 WK ULTRASOUND
TECHNIQUE: Transabdominal ultrasound was performed for evaluation of the
gestation as well as the maternal uterus and adnexal regions.

[Series 1: obstetric <14 wk ultrasound · 15 of 39 slices shown]
[im 1/39]
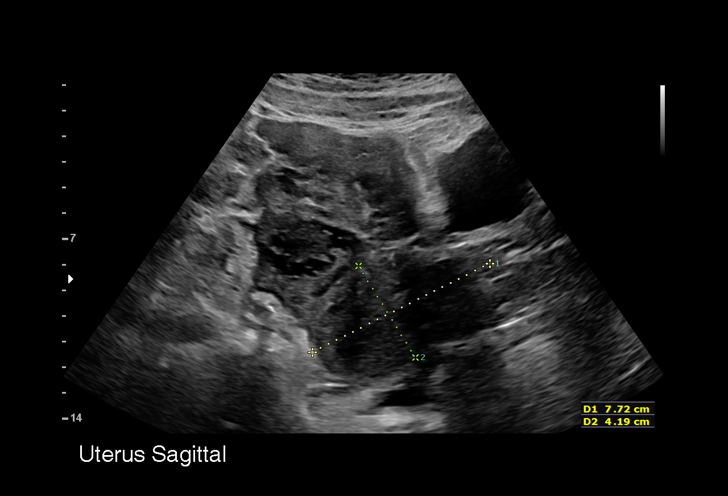
[im 3/39]
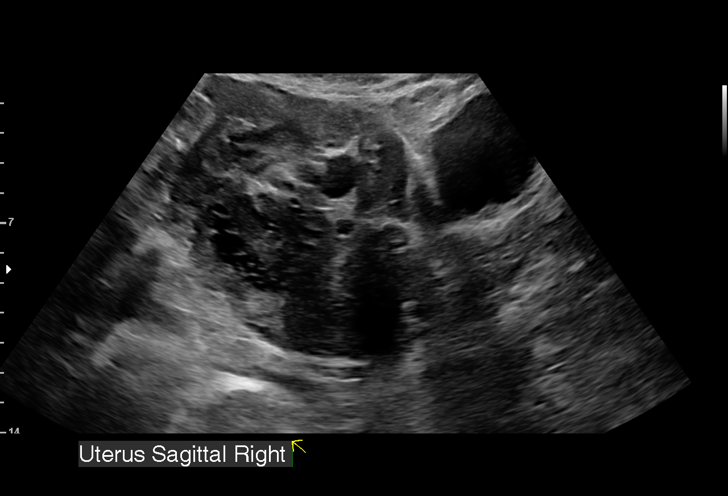
[im 6/39]
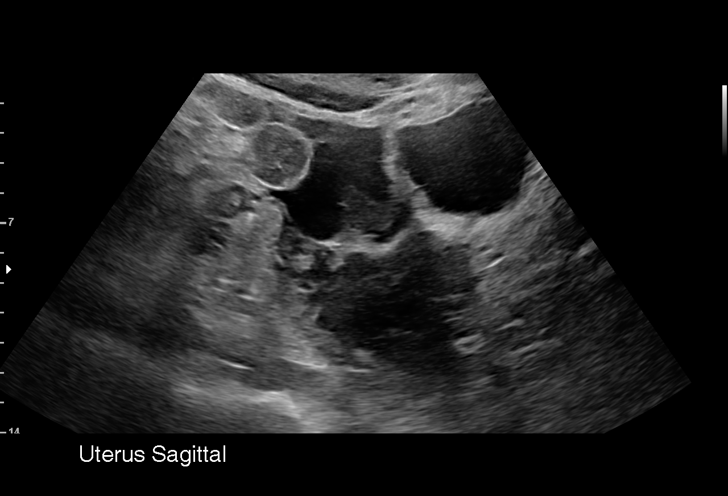
[im 9/39]
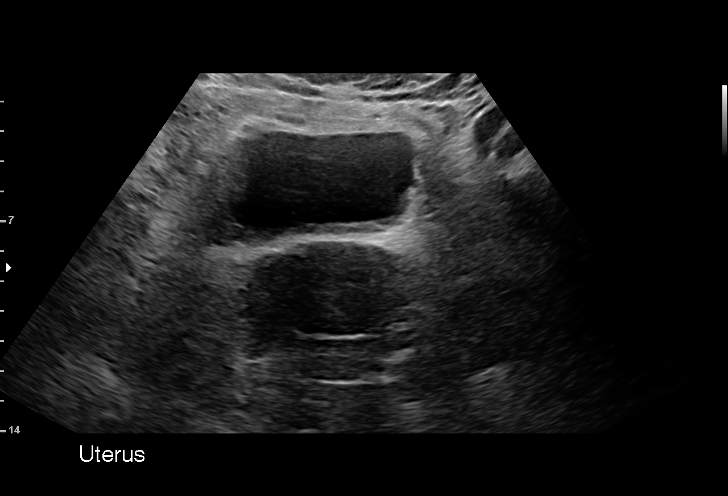
[im 12/39]
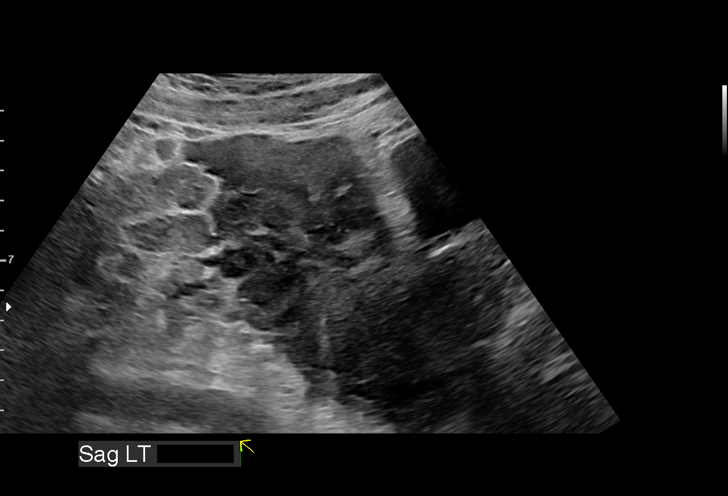
[im 15/39]
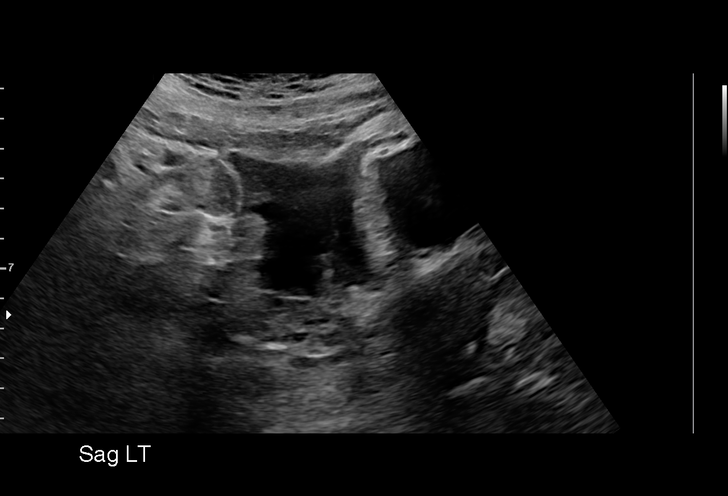
[im 17/39]
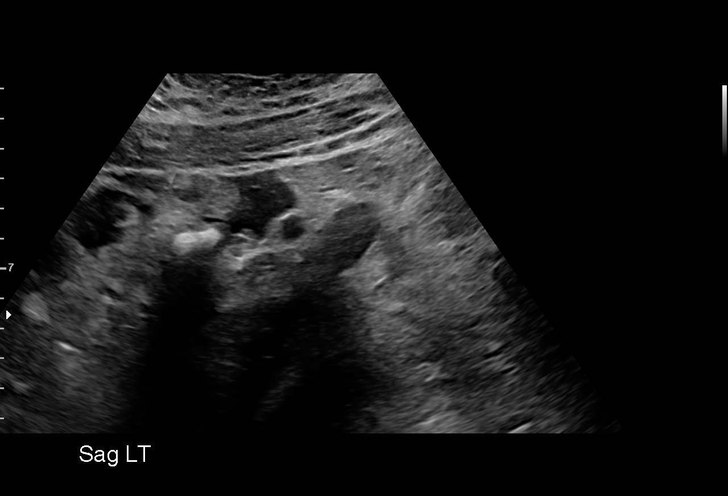
[im 20/39]
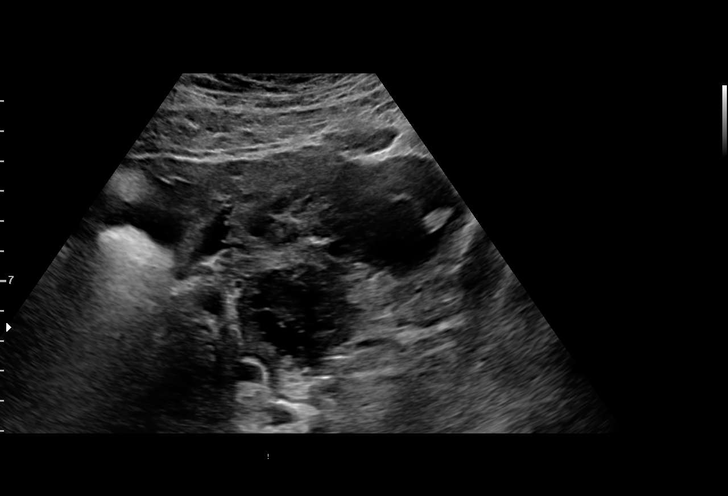
[im 22/39]
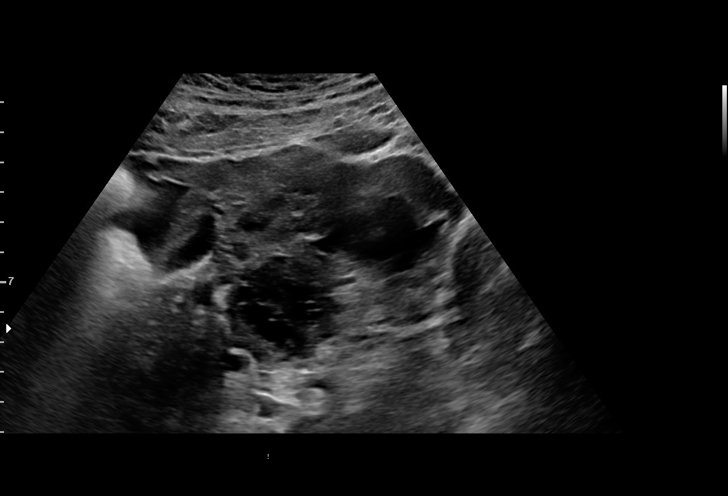
[im 24/39]
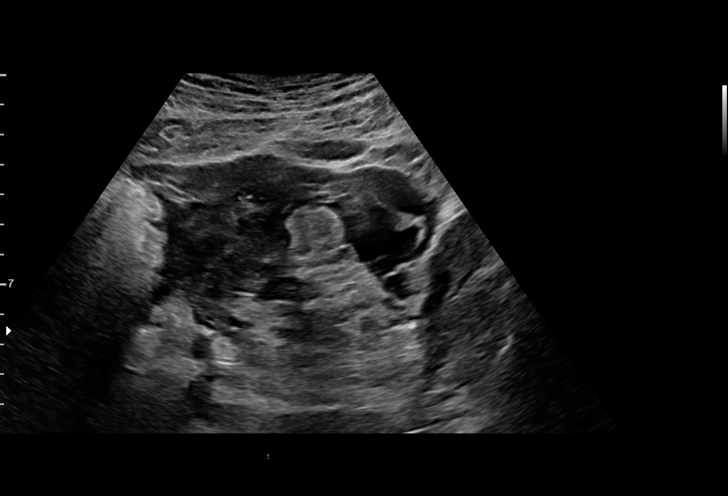
[im 27/39]
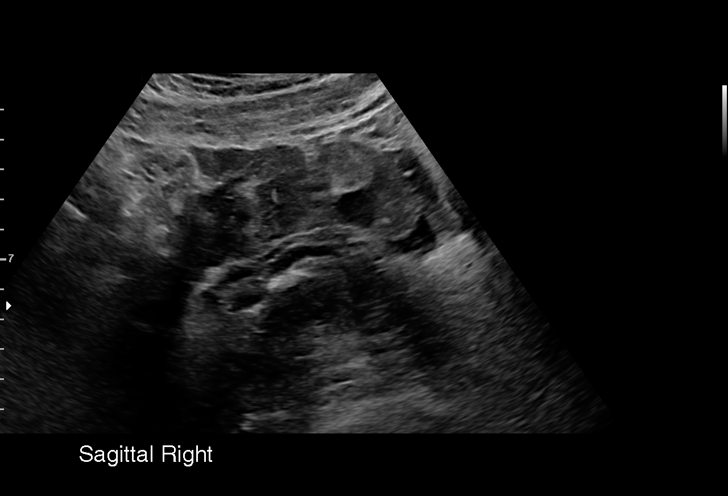
[im 30/39]
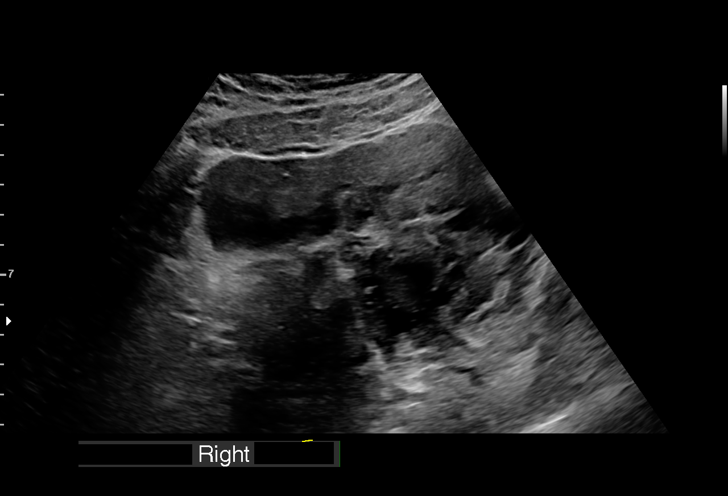
[im 33/39]
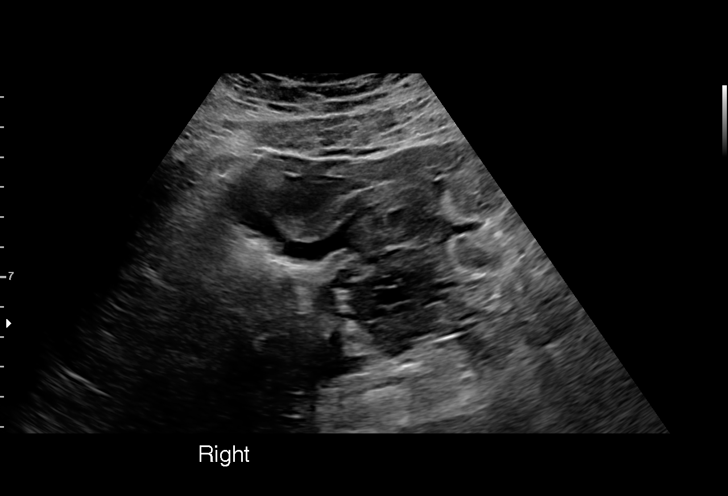
[im 36/39]
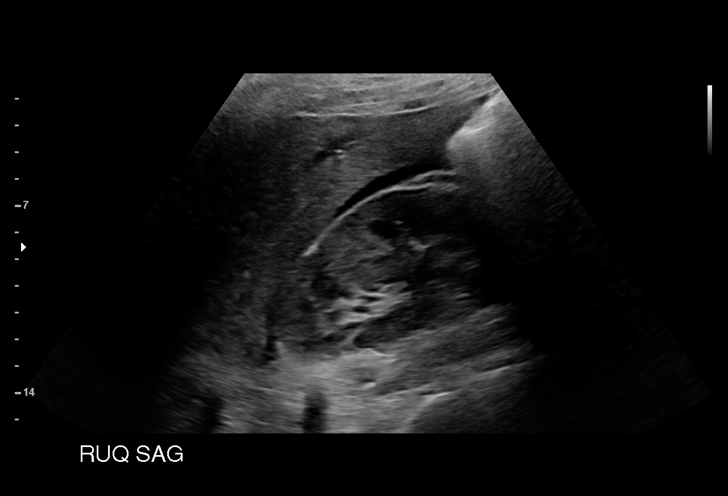
[im 39/39]
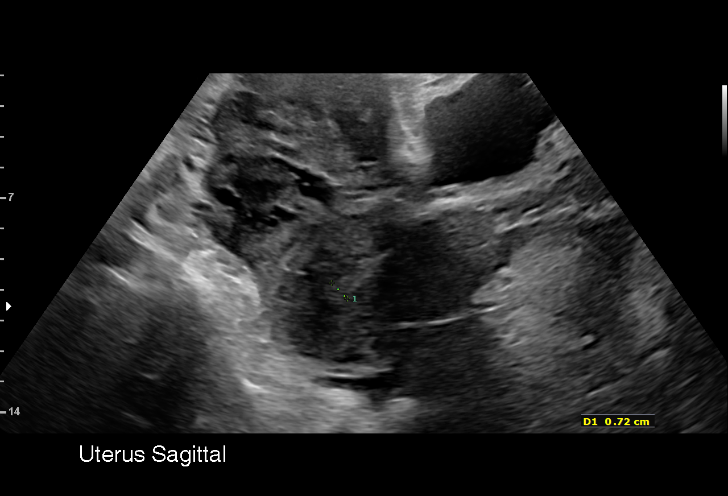

[15 of 28 positions shown; findings below may reference images not displayed]

FINDINGS: Intrauterine gestational sac: None

Yolk sac:  Not Visualized.

Embryo:  Not Visualized.

Maternal uterus/adnexae: The uterus is retroverted. No intrauterine
gestational sac or fluid. Endometrium measures approximately 7 mm.
Heterogeneous complex masslike structure measuring 3.4 x 3.4 x
cm cephalad to the uterine fundus. Large amount of complex fluid in
blood clot throughout the pelvis. Fluid tracks in the right upper
quadrant inferior to the liver. Neither ovary was discretely
visualized.
IMPRESSION: Large amount of complex fluid in the pelvis consistent with rupture
of known ectopic pregnancy. Complex cystic structure adjacent to the
uterine fundus measuring 3.4 cm likely represents the known ectopic.

Critical Value/emergent results were called by telephone at the time
who verbally acknowledged these results.

## 2021-01-12 ENCOUNTER — Ambulatory Visit (INDEPENDENT_AMBULATORY_CARE_PROVIDER_SITE_OTHER): Payer: 59 | Admitting: Family Medicine

## 2021-01-12 ENCOUNTER — Encounter: Payer: Self-pay | Admitting: Family Medicine

## 2021-01-12 ENCOUNTER — Other Ambulatory Visit: Payer: Self-pay

## 2021-01-12 VITALS — BP 122/86 | HR 75 | Temp 97.7°F | Ht 66.5 in | Wt 272.0 lb

## 2021-01-12 DIAGNOSIS — Z0000 Encounter for general adult medical examination without abnormal findings: Secondary | ICD-10-CM

## 2021-01-12 DIAGNOSIS — Z13220 Encounter for screening for lipoid disorders: Secondary | ICD-10-CM

## 2021-01-12 DIAGNOSIS — E559 Vitamin D deficiency, unspecified: Secondary | ICD-10-CM

## 2021-01-12 DIAGNOSIS — L309 Dermatitis, unspecified: Secondary | ICD-10-CM | POA: Diagnosis not present

## 2021-01-12 LAB — CBC WITH DIFFERENTIAL/PLATELET
Basophils Absolute: 0 10*3/uL (ref 0.0–0.1)
Basophils Relative: 0.6 % (ref 0.0–3.0)
Eosinophils Absolute: 0 10*3/uL (ref 0.0–0.7)
Eosinophils Relative: 0.5 % (ref 0.0–5.0)
HCT: 38.9 % (ref 36.0–46.0)
Hemoglobin: 13.1 g/dL (ref 12.0–15.0)
Lymphocytes Relative: 24.7 % (ref 12.0–46.0)
Lymphs Abs: 1.8 10*3/uL (ref 0.7–4.0)
MCHC: 33.7 g/dL (ref 30.0–36.0)
MCV: 83.8 fl (ref 78.0–100.0)
Monocytes Absolute: 0.5 10*3/uL (ref 0.1–1.0)
Monocytes Relative: 6.6 % (ref 3.0–12.0)
Neutro Abs: 4.9 10*3/uL (ref 1.4–7.7)
Neutrophils Relative %: 67.6 % (ref 43.0–77.0)
Platelets: 275 10*3/uL (ref 150.0–400.0)
RBC: 4.64 Mil/uL (ref 3.87–5.11)
RDW: 13.6 % (ref 11.5–15.5)
WBC: 7.2 10*3/uL (ref 4.0–10.5)

## 2021-01-12 LAB — T4, FREE: Free T4: 0.79 ng/dL (ref 0.60–1.60)

## 2021-01-12 LAB — HEMOGLOBIN A1C: Hgb A1c MFr Bld: 5.7 % (ref 4.6–6.5)

## 2021-01-12 LAB — TSH: TSH: 2.05 u[IU]/mL (ref 0.35–4.50)

## 2021-01-12 LAB — LIPID PANEL
Cholesterol: 192 mg/dL (ref 0–200)
HDL: 43.1 mg/dL (ref >39.00)
LDL Cholesterol: 133 mg/dL — ABNORMAL HIGH (ref 0–99)
NonHDL: 148.96
Total CHOL/HDL Ratio: 4
Triglycerides: 82 mg/dL (ref 0.0–149.0)
VLDL: 16.4 mg/dL (ref 0.0–40.0)

## 2021-01-12 LAB — VITAMIN D 25 HYDROXY (VIT D DEFICIENCY, FRACTURES): VITD: 15.43 ng/mL — ABNORMAL LOW (ref 30.00–100.00)

## 2021-01-12 MED ORDER — VITAMIN D (ERGOCALCIFEROL) 1.25 MG (50000 UNIT) PO CAPS
50000.0000 [IU] | ORAL_CAPSULE | ORAL | 0 refills | Status: DC
Start: 2021-01-12 — End: 2022-03-20

## 2021-01-12 MED ORDER — TRIAMCINOLONE ACETONIDE 0.1 % EX CREA: 1.0000 "application " | TOPICAL_CREAM | Freq: Two times a day (BID) | CUTANEOUS | 2 refills | Status: DC

## 2021-01-12 NOTE — Progress Notes (Signed)
Subjective:     Cynthia Mckee is a 35 y.o. female and is here for a comprehensive physical exam. The patient reports no problems.  Doing well.  Patient stays busy with work attention, her husband, and her 33-year-old daughter.  Patient requesting a refill on cream for eczema.  Social History   Socioeconomic History   Marital status: Married    Spouse name: Not on file   Number of children: Not on file   Years of education: Not on file   Highest education level: Not on file  Occupational History   Not on file  Tobacco Use   Smoking status: Never   Smokeless tobacco: Never  Substance and Sexual Activity   Alcohol use: No   Drug use: No   Sexual activity: Yes    Birth control/protection: None  Other Topics Concern   Not on file  Social History Narrative   Not on file   Social Determinants of Health   Financial Resource Strain: Not on file  Food Insecurity: Not on file  Transportation Needs: Not on file  Physical Activity: Not on file  Stress: Not on file  Social Connections: Not on file  Intimate Partner Violence: Not on file   Health Maintenance  Topic Date Due   Pneumococcal Vaccine 73-40 Years old (1 - PCV) Never done   Hepatitis C Screening  Never done   Zoster Vaccines- Shingrix (1 of 2) Never done   COVID-19 Vaccine (3 - Moderna risk series) 06/05/2020   INFLUENZA VACCINE  03/05/2021   PAP SMEAR-Modifier  12/01/2022   TETANUS/TDAP  09/09/2026   HIV Screening  Completed   HPV VACCINES  Aged Out    The following portions of the patient's history were reviewed and updated as appropriate: allergies, current medications, past family history, past medical history, past social history, past surgical history, and problem list.  Review of Systems A comprehensive review of systems was negative.   Objective:    BP 122/86 (BP Location: Right Arm, Patient Position: Sitting, Cuff Size: Large)   Pulse 75   Temp 97.7 F (36.5 C) (Oral)   Ht 5' 6.5" (1.689  m)   Wt 272 lb (123.4 kg)   LMP 01/04/2021   SpO2 97%   BMI 43.24 kg/m  General appearance: alert, cooperative, and no distress Head: Normocephalic, without obvious abnormality, atraumatic Eyes: conjunctivae/corneas clear. PERRL, EOM's intact. Fundi benign. Ears: normal TM's and external ear canals both ears Nose: Nares normal. Septum midline. Mucosa normal. No drainage or sinus tenderness. Throat: lips, mucosa, and tongue normal; teeth and gums normal Neck: no adenopathy, no carotid bruit, no JVD, supple, symmetrical, trachea midline, and thyroid not enlarged, symmetric, no tenderness/mass/nodules Lungs: clear to auscultation bilaterally Heart: regular rate and rhythm, S1, S2 normal, no murmur, click, rub or gallop Abdomen: soft, non-tender; bowel sounds normal; no masses,  no organomegaly Extremities: extremities normal, atraumatic, no cyanosis or edema Pulses: 2+ and symmetric Skin: Skin color, texture, turgor normal. No rashes or lesions Lymph nodes: Cervical, supraclavicular, and axillary nodes normal. Neurologic: Alert and oriented X 3, normal strength and tone. Normal symmetric reflexes. Normal coordination and gait    Assessment:    Healthy female exam.      Plan:    Anticipatory guidance given including wearing seatbelts, smoke detectors in the home, increasing physical activity, increasing p.o. intake of water and vegetables. -We will obtain labs -Pap up-to-date -Immunizations up-to-date -Given handout -Next CPE in 1 yr See After Visit Summary  for Counseling Recommendations   Screening for cholesterol level  - Plan: Lipid panel  Eczema -Controlled -Continue triamcinolone cream as needed -Plan: Triamcinolone cream  Vitamin D deficiency noted on labs from today's visit.  Vitamin D 15.43.  Rx for ergocalciferol 50,000 IU weekly sent to patient's pharmacy.  Lifestyle medications encouraged for slightly elevated LDL cholesterol.  Follow-up as needed  Abbe Amsterdam, MD

## 2021-09-25 DIAGNOSIS — Z3200 Encounter for pregnancy test, result unknown: Secondary | ICD-10-CM | POA: Diagnosis not present

## 2021-09-27 DIAGNOSIS — Z3200 Encounter for pregnancy test, result unknown: Secondary | ICD-10-CM | POA: Diagnosis not present

## 2021-10-02 DIAGNOSIS — Z3200 Encounter for pregnancy test, result unknown: Secondary | ICD-10-CM | POA: Diagnosis not present

## 2021-10-16 DIAGNOSIS — O2620 Pregnancy care for patient with recurrent pregnancy loss, unspecified trimester: Secondary | ICD-10-CM | POA: Diagnosis not present

## 2021-10-16 DIAGNOSIS — Z3200 Encounter for pregnancy test, result unknown: Secondary | ICD-10-CM | POA: Diagnosis not present

## 2021-10-16 DIAGNOSIS — Z8759 Personal history of other complications of pregnancy, childbirth and the puerperium: Secondary | ICD-10-CM | POA: Diagnosis not present

## 2021-10-26 DIAGNOSIS — O4690 Antepartum hemorrhage, unspecified, unspecified trimester: Secondary | ICD-10-CM | POA: Diagnosis not present

## 2021-11-05 DIAGNOSIS — O021 Missed abortion: Secondary | ICD-10-CM | POA: Diagnosis not present

## 2021-11-19 DIAGNOSIS — N96 Recurrent pregnancy loss: Secondary | ICD-10-CM | POA: Diagnosis not present

## 2021-11-19 DIAGNOSIS — O039 Complete or unspecified spontaneous abortion without complication: Secondary | ICD-10-CM | POA: Diagnosis not present

## 2022-01-07 DIAGNOSIS — E282 Polycystic ovarian syndrome: Secondary | ICD-10-CM | POA: Diagnosis not present

## 2022-01-07 DIAGNOSIS — Z3200 Encounter for pregnancy test, result unknown: Secondary | ICD-10-CM | POA: Diagnosis not present

## 2022-01-07 DIAGNOSIS — N96 Recurrent pregnancy loss: Secondary | ICD-10-CM | POA: Diagnosis not present

## 2022-03-15 ENCOUNTER — Ambulatory Visit (INDEPENDENT_AMBULATORY_CARE_PROVIDER_SITE_OTHER): Payer: 59 | Admitting: Family Medicine

## 2022-03-15 ENCOUNTER — Encounter: Payer: Self-pay | Admitting: Family Medicine

## 2022-03-15 VITALS — BP 120/86 | HR 77 | Temp 98.7°F | Ht 67.0 in | Wt 272.0 lb

## 2022-03-15 DIAGNOSIS — Z6841 Body Mass Index (BMI) 40.0 and over, adult: Secondary | ICD-10-CM

## 2022-03-15 DIAGNOSIS — E782 Mixed hyperlipidemia: Secondary | ICD-10-CM

## 2022-03-15 DIAGNOSIS — E559 Vitamin D deficiency, unspecified: Secondary | ICD-10-CM

## 2022-03-15 DIAGNOSIS — Z0000 Encounter for general adult medical examination without abnormal findings: Secondary | ICD-10-CM

## 2022-03-15 LAB — CBC WITH DIFFERENTIAL/PLATELET
Basophils Absolute: 0 10*3/uL (ref 0.0–0.1)
Basophils Relative: 0.5 % (ref 0.0–3.0)
Eosinophils Absolute: 0 10*3/uL (ref 0.0–0.7)
Eosinophils Relative: 0.4 % (ref 0.0–5.0)
HCT: 38.7 % (ref 36.0–46.0)
Hemoglobin: 12.6 g/dL (ref 12.0–15.0)
Lymphocytes Relative: 29.7 % (ref 12.0–46.0)
Lymphs Abs: 2 10*3/uL (ref 0.7–4.0)
MCHC: 32.5 g/dL (ref 30.0–36.0)
MCV: 85.1 fl (ref 78.0–100.0)
Monocytes Absolute: 0.4 10*3/uL (ref 0.1–1.0)
Monocytes Relative: 6.2 % (ref 3.0–12.0)
Neutro Abs: 4.3 10*3/uL (ref 1.4–7.7)
Neutrophils Relative %: 63.2 % (ref 43.0–77.0)
Platelets: 256 10*3/uL (ref 150.0–400.0)
RBC: 4.54 Mil/uL (ref 3.87–5.11)
RDW: 14 % (ref 11.5–15.5)
WBC: 6.7 10*3/uL (ref 4.0–10.5)

## 2022-03-15 LAB — BASIC METABOLIC PANEL
BUN: 11 mg/dL (ref 6–23)
CO2: 28 mEq/L (ref 19–32)
Calcium: 9.2 mg/dL (ref 8.4–10.5)
Chloride: 103 mEq/L (ref 96–112)
Creatinine, Ser: 0.73 mg/dL (ref 0.40–1.20)
GFR: 106.89 mL/min (ref >60.00)
Glucose, Bld: 95 mg/dL (ref 70–99)
Potassium: 4.4 mEq/L (ref 3.5–5.1)
Sodium: 136 mEq/L (ref 135–145)

## 2022-03-15 LAB — LIPID PANEL
Cholesterol: 201 mg/dL — ABNORMAL HIGH (ref 0–200)
HDL: 45.4 mg/dL (ref >39.00)
LDL Cholesterol: 138 mg/dL — ABNORMAL HIGH (ref 0–99)
NonHDL: 155.64
Total CHOL/HDL Ratio: 4
Triglycerides: 89 mg/dL (ref 0.0–149.0)
VLDL: 17.8 mg/dL (ref 0.0–40.0)

## 2022-03-15 LAB — TSH: TSH: 1.73 u[IU]/mL (ref 0.35–5.50)

## 2022-03-15 LAB — VITAMIN D 25 HYDROXY (VIT D DEFICIENCY, FRACTURES): VITD: 11.81 ng/mL — ABNORMAL LOW (ref 30.00–100.00)

## 2022-03-15 LAB — HEMOGLOBIN A1C: Hgb A1c MFr Bld: 5.8 % (ref 4.6–6.5)

## 2022-03-15 LAB — T4, FREE: Free T4: 0.72 ng/dL (ref 0.60–1.60)

## 2022-03-15 NOTE — Progress Notes (Signed)
Subjective:     Cynthia Mckee is a 36 y.o. female and is here for a comprehensive physical exam. The patient reports no problems.  Patient states overall she is doing well. Pt's daughter will be starting school.  Endorses a pregnancy loss earlier in the year, but doing okay.  Followed by Dr. Cherly Hensen and seeing [facility].  Last Pap 12/02/2019.  Social History   Socioeconomic History   Marital status: Married    Spouse name: Not on file   Number of children: Not on file   Years of education: Not on file   Highest education level: Not on file  Occupational History   Not on file  Tobacco Use   Smoking status: Never   Smokeless tobacco: Never  Substance and Sexual Activity   Alcohol use: No   Drug use: No   Sexual activity: Yes    Birth control/protection: None  Other Topics Concern   Not on file  Social History Narrative   Not on file   Social Determinants of Health   Financial Resource Strain: Not on file  Food Insecurity: Not on file  Transportation Needs: Not on file  Physical Activity: Not on file  Stress: Not on file  Social Connections: Not on file  Intimate Partner Violence: Not on file   Health Maintenance  Topic Date Due   Hepatitis C Screening  Never done   COVID-19 Vaccine (3 - Moderna risk series) 06/05/2020   INFLUENZA VACCINE  03/05/2022   PAP SMEAR-Modifier  12/01/2022   TETANUS/TDAP  09/09/2026   HIV Screening  Completed   HPV VACCINES  Aged Out    The following portions of the patient's history were reviewed and updated as appropriate: allergies, current medications, past family history, past medical history, past social history, past surgical history, and problem list.  Review of Systems Pertinent items noted in HPI and remainder of comprehensive ROS otherwise negative.   Objective:    BP 120/86 (BP Location: Left Arm, Patient Position: Sitting, Cuff Size: Large)   Pulse 77   Temp 98.7 F (37.1 C) (Oral)   Ht 5\' 7"  (1.702  m)   Wt 272 lb (123.4 kg)   SpO2 97%   BMI 42.60 kg/m  General appearance: alert, cooperative, and no distress Head: Normocephalic, without obvious abnormality, atraumatic Eyes: conjunctivae/corneas clear. PERRL, EOM's intact. Fundi benign. Ears: normal TM's and external ear canals both ears Nose: Nares normal. Septum midline. Mucosa normal. No drainage or sinus tenderness. Throat: lips, mucosa, and tongue normal; teeth and gums normal Neck: no adenopathy, no carotid bruit, no JVD, supple, symmetrical, trachea midline, and thyroid not enlarged, symmetric, no tenderness/mass/nodules Lungs: clear to auscultation bilaterally Heart: regular rate and rhythm, S1, S2 normal, no murmur, click, rub or gallop Abdomen: soft, non-tender; bowel sounds normal; no masses,  no organomegaly Extremities: extremities normal, atraumatic, no cyanosis or edema Pulses: 2+ and symmetric Skin: Skin color, texture, turgor normal. No rashes or lesions Lymph nodes: Cervical, supraclavicular, and axillary nodes normal. Neurologic: Alert and oriented X 3, normal strength and tone. Normal symmetric reflexes. Normal coordination and gait    Assessment:    Healthy female exam.      Plan:    Well adult exam Anticipatory guidance given including wearing seatbelts, smoke detectors in the home, increasing physical activity, increasing p.o. intake of water and vegetables. -labs -immunizatons reviewed -Pap up-to-date done 12/02/2019 -Next CPE in 1 year See After Visit Summary for Counseling Recommendations   - Plan:  Vitamin D, 25-hydroxy, CBC with Differential/Platelet, Basic metabolic panel, TSH, T4, Free, Hemoglobin A1c  Vitamin D deficiency - Plan: Vitamin D, 25-hydroxy  Mixed hyperlipidemia - Plan: Lipid panel  Class 3 severe obesity due to excess calories without serious comorbidity with body mass index (BMI) of 40.0 to 44.9 in adult [facility])  F/u prn  Abbe Amsterdam, MD

## 2022-03-20 ENCOUNTER — Other Ambulatory Visit (HOSPITAL_COMMUNITY): Payer: Self-pay

## 2022-03-20 ENCOUNTER — Other Ambulatory Visit: Payer: Self-pay | Admitting: Family Medicine

## 2022-03-20 DIAGNOSIS — E559 Vitamin D deficiency, unspecified: Secondary | ICD-10-CM

## 2022-03-20 MED ORDER — VITAMIN D (ERGOCALCIFEROL) 1.25 MG (50000 UNIT) PO CAPS: 50000.0000 [IU] | ORAL_CAPSULE | ORAL | 0 refills | Status: DC

## 2022-03-20 MED ORDER — VITAMIN D (ERGOCALCIFEROL) 1.25 MG (50000 UNIT) PO CAPS
50000.0000 [IU] | ORAL_CAPSULE | ORAL | 0 refills | Status: DC
  Filled 2022-03-20: qty 12, 84d supply, fill #0

## 2022-03-20 NOTE — Addendum Note (Signed)
Addended by: Abbe Amsterdam R on: 03/20/2022 01:21 PM   Modules accepted: Orders

## 2022-03-26 ENCOUNTER — Other Ambulatory Visit: Payer: Self-pay

## 2022-03-26 DIAGNOSIS — E559 Vitamin D deficiency, unspecified: Secondary | ICD-10-CM

## 2022-03-26 MED ORDER — VITAMIN D (ERGOCALCIFEROL) 1.25 MG (50000 UNIT) PO CAPS
50000.0000 [IU] | ORAL_CAPSULE | ORAL | 0 refills | Status: DC
  Filled 2022-03-26: qty 12, 84d supply, fill #0

## 2022-04-05 ENCOUNTER — Other Ambulatory Visit: Payer: Self-pay

## 2022-07-03 ENCOUNTER — Ambulatory Visit: Payer: 59 | Admitting: Family Medicine

## 2023-03-05 ENCOUNTER — Encounter (INDEPENDENT_AMBULATORY_CARE_PROVIDER_SITE_OTHER): Payer: Self-pay

## 2023-03-20 ENCOUNTER — Other Ambulatory Visit: Payer: Self-pay

## 2023-03-20 ENCOUNTER — Encounter: Payer: Self-pay | Admitting: Family Medicine

## 2023-03-20 ENCOUNTER — Ambulatory Visit (INDEPENDENT_AMBULATORY_CARE_PROVIDER_SITE_OTHER): Payer: 59 | Admitting: Family Medicine

## 2023-03-20 VITALS — BP 124/80 | HR 78 | Temp 98.1°F | Ht 67.0 in | Wt 275.6 lb

## 2023-03-20 DIAGNOSIS — L309 Dermatitis, unspecified: Secondary | ICD-10-CM | POA: Diagnosis not present

## 2023-03-20 DIAGNOSIS — E559 Vitamin D deficiency, unspecified: Secondary | ICD-10-CM | POA: Diagnosis not present

## 2023-03-20 DIAGNOSIS — E282 Polycystic ovarian syndrome: Secondary | ICD-10-CM | POA: Insufficient documentation

## 2023-03-20 DIAGNOSIS — Z0000 Encounter for general adult medical examination without abnormal findings: Secondary | ICD-10-CM

## 2023-03-20 DIAGNOSIS — Z6841 Body Mass Index (BMI) 40.0 and over, adult: Secondary | ICD-10-CM | POA: Diagnosis not present

## 2023-03-20 LAB — CBC WITH DIFFERENTIAL/PLATELET
Basophils Absolute: 0 10*3/uL (ref 0.0–0.1)
Basophils Relative: 0.7 % (ref 0.0–3.0)
Eosinophils Absolute: 0.1 10*3/uL (ref 0.0–0.7)
Eosinophils Relative: 1 % (ref 0.0–5.0)
HCT: 38.6 % (ref 36.0–46.0)
Hemoglobin: 12.2 g/dL (ref 12.0–15.0)
Lymphocytes Relative: 27.4 % (ref 12.0–46.0)
Lymphs Abs: 2 10*3/uL (ref 0.7–4.0)
MCHC: 31.7 g/dL (ref 30.0–36.0)
MCV: 85.5 fl (ref 78.0–100.0)
Monocytes Absolute: 0.5 10*3/uL (ref 0.1–1.0)
Monocytes Relative: 6.7 % (ref 3.0–12.0)
Neutro Abs: 4.7 10*3/uL (ref 1.4–7.7)
Neutrophils Relative %: 64.2 % (ref 43.0–77.0)
Platelets: 267 10*3/uL (ref 150.0–400.0)
RBC: 4.52 Mil/uL (ref 3.87–5.11)
RDW: 14.4 % (ref 11.5–15.5)
WBC: 7.3 10*3/uL (ref 4.0–10.5)

## 2023-03-20 LAB — TSH: TSH: 1.71 u[IU]/mL (ref 0.35–5.50)

## 2023-03-20 LAB — LIPID PANEL
Cholesterol: 168 mg/dL (ref 0–200)
HDL: 43.2 mg/dL (ref >39.00)
LDL Cholesterol: 112 mg/dL — ABNORMAL HIGH (ref 0–99)
NonHDL: 124.98
Total CHOL/HDL Ratio: 4
Triglycerides: 66 mg/dL (ref 0.0–149.0)
VLDL: 13.2 mg/dL (ref 0.0–40.0)

## 2023-03-20 LAB — COMPREHENSIVE METABOLIC PANEL
ALT: 10 U/L (ref 0–35)
AST: 15 U/L (ref 0–37)
Albumin: 4 g/dL (ref 3.5–5.2)
Alkaline Phosphatase: 41 U/L (ref 39–117)
BUN: 12 mg/dL (ref 6–23)
CO2: 26 mEq/L (ref 19–32)
Calcium: 9.2 mg/dL (ref 8.4–10.5)
Chloride: 102 mEq/L (ref 96–112)
Creatinine, Ser: 0.68 mg/dL (ref 0.40–1.20)
GFR: 112.4 mL/min (ref >60.00)
Glucose, Bld: 93 mg/dL (ref 70–99)
Potassium: 4 mEq/L (ref 3.5–5.1)
Sodium: 134 mEq/L — ABNORMAL LOW (ref 135–145)
Total Bilirubin: 0.3 mg/dL (ref 0.2–1.2)
Total Protein: 7.4 g/dL (ref 6.0–8.3)

## 2023-03-20 LAB — HEMOGLOBIN A1C: Hgb A1c MFr Bld: 5.6 % (ref 4.6–6.5)

## 2023-03-20 LAB — T4, FREE: Free T4: 0.78 ng/dL (ref 0.60–1.60)

## 2023-03-20 LAB — VITAMIN D 25 HYDROXY (VIT D DEFICIENCY, FRACTURES): VITD: 19.37 ng/mL — ABNORMAL LOW (ref 30.00–100.00)

## 2023-03-20 MED ORDER — TRIAMCINOLONE ACETONIDE 0.1 % EX CREA
1.0000 | TOPICAL_CREAM | Freq: Two times a day (BID) | CUTANEOUS | 2 refills | Status: AC
  Filled 2023-03-20: qty 30, 15d supply, fill #0

## 2023-03-20 NOTE — Progress Notes (Signed)
Established Patient Office Visit   Subjective  Patient ID: Cynthia Mckee, female    DOB: 12/13/1985  Age: 37 y.o. MRN: [id number]  Chief Complaint  Patient presents with   Annual Exam    Patient is a 37 year old female who presents for CPE.  Patient states she is doing well overall.  Staying busy with work and family.  Patient's daughter will be starting the first grade.  Patient without concern.  No recent issues with eczema.    Past Medical History:  Diagnosis Date   Anemia    one year ago, last check was normal   Past Surgical History:  Procedure Laterality Date   DIAGNOSTIC LAPAROSCOPY WITH REMOVAL OF ECTOPIC PREGNANCY N/A 12/17/2018   Procedure: DIAGNOSTIC LAPAROSCOPY WITH REMOVAL OF ECTOPIC PREGNANCY, RIGHT SALPINGECTOMY;  Surgeon: Lazaro Arms, MD;  Location: [facility];  Service: Gynecology;  Laterality: N/A;   DILATATION & CURRETTAGE/HYSTEROSCOPY WITH RESECTOCOPE N/A 03/16/2015   Procedure: DILATATION & CURETTAGE/HYSTEROSCOPY WITH RESECTOCOPE;  Surgeon: Maxie Better, MD;  Location: [facility];  Service: Gynecology;  Laterality: N/A;   miscarriage     WISDOM TOOTH EXTRACTION  2011   Social History   Tobacco Use   Smoking status: Never   Smokeless tobacco: Never  Substance Use Topics   Drug use: No   Family History  Problem Relation Age of Onset   Hypertension Mother    Cancer Sister        osteoscarcoma at age 28.  in remission x 10 yrs   Early death Sister    Miscarriages / [place] Sister    No Known Allergies    ROS Negative unless stated above    Objective:     BP 124/80 (BP Location: Left Arm, Patient Position: Sitting, Cuff Size: Large)   Pulse 78   Temp 98.1 F (36.7 C) (Oral)   Ht 5\' 7"  (1.702 m)   Wt 275 lb 9.6 oz (125 kg)   LMP 03/03/2023 (Exact Date)   SpO2 97%   BMI 43.17 kg/m  BP Readings from Last 3 Encounters:  03/20/23 124/80  03/15/22 120/86  01/12/21 122/86   Wt Readings from Last 3 Encounters:  03/20/23  275 lb 9.6 oz (125 kg)  03/15/22 272 lb (123.4 kg)  01/12/21 272 lb (123.4 kg)      Physical Exam Constitutional:      Appearance: Normal appearance.  HENT:     Head: Normocephalic and atraumatic.     Right Ear: Tympanic membrane, ear canal and external ear normal.     Left Ear: Tympanic membrane, ear canal and external ear normal.     Nose: Nose normal.     Mouth/Throat:     Mouth: Mucous membranes are moist.     Pharynx: No oropharyngeal exudate or posterior oropharyngeal erythema.  Eyes:     General: No scleral icterus.    Extraocular Movements: Extraocular movements intact.     Conjunctiva/sclera: Conjunctivae normal.     Pupils: Pupils are equal, round, and reactive to light.  Neck:     Thyroid: No thyromegaly.  Cardiovascular:     Rate and Rhythm: Normal rate and regular rhythm.     Pulses: Normal pulses.     Heart sounds: Normal heart sounds. No murmur heard.    No friction rub.  Pulmonary:     Effort: Pulmonary effort is normal.     Breath sounds: Normal breath sounds. No wheezing, rhonchi or rales.  Abdominal:     General:  Bowel sounds are normal.     Palpations: Abdomen is soft.     Tenderness: There is no abdominal tenderness.  Musculoskeletal:        General: No deformity. Normal range of motion.  Lymphadenopathy:     Cervical: No cervical adenopathy.  Skin:    General: Skin is warm and dry.     Findings: No lesion.  Neurological:     General: No focal deficit present.     Mental Status: She is alert and oriented to person, place, and time.  Psychiatric:        Mood and Affect: Mood normal.        Thought Content: Thought content normal.       03/20/2023    8:32 AM 03/15/2022    9:33 AM 01/12/2021    8:08 AM  Depression screen PHQ 2/9  Decreased Interest 0 0 0  Down, Depressed, Hopeless 0 0 0  PHQ - 2 Score 0 0 0  Altered sleeping 0 2 0  Tired, decreased energy 0 1 0  Change in appetite 0 0 0  Feeling bad or failure about yourself  0 0 0   Trouble concentrating 0 0 0  Moving slowly or fidgety/restless 0 0 0  Suicidal thoughts 0 0 0  PHQ-9 Score 0 3 0  Difficult doing work/chores  Not difficult at all       03/20/2023    8:32 AM 01/12/2021    8:09 AM  GAD 7 : Generalized Anxiety Score  Nervous, Anxious, on Edge 0 0  Control/stop worrying 0 0  Worry too much - different things 0 0  Trouble relaxing 0 0  Restless 0 0  Easily annoyed or irritable 0 0  Afraid - awful might happen 0 0  Total GAD 7 Score 0 0      No results found for any visits on 03/20/23.    Assessment & Plan:  Well adult exam -     CBC with Differential/Platelet -     TSH -     T4, free -     Hemoglobin A1c -     Lipid panel -     Comprehensive metabolic panel  Vitamin D deficiency -     VITAMIN D 25 Hydroxy (Vit-D Deficiency, Fractures)  Class 3 severe obesity without serious comorbidity with body mass index (BMI) of 40.0 to 44.9 in adult, unspecified obesity type (HCC) -     TSH -     T4, free -     Hemoglobin A1c -     Lipid panel -     Comprehensive metabolic panel -     VITAMIN D 25 Hydroxy (Vit-D Deficiency, Fractures)  Eczema, unspecified type -     Triamcinolone Acetonide; Apply 1 Application topically 2 (two) times daily.  Dispense: 30 g; Refill: 2  Age-appropriate health screenings discussed.  Will obtain labs.  Immunizations reviewed.  Pap done 12/02/2019.  Repeat in 5 years with OB/GYN.  Eczema stable.  Sending in refill of triamcinolone cream in case patient needs it.  No follow-ups on file.   Deeann Saint, MD

## 2023-03-25 ENCOUNTER — Other Ambulatory Visit: Payer: Self-pay

## 2023-03-25 MED ORDER — VITAMIN D (ERGOCALCIFEROL) 1.25 MG (50000 UNIT) PO CAPS
50000.0000 [IU] | ORAL_CAPSULE | ORAL | 0 refills | Status: DC
  Filled 2023-03-25: qty 12, 84d supply, fill #0

## 2023-04-11 ENCOUNTER — Other Ambulatory Visit: Payer: Self-pay

## 2023-04-23 DIAGNOSIS — N912 Amenorrhea, unspecified: Secondary | ICD-10-CM | POA: Diagnosis not present

## 2023-04-30 DIAGNOSIS — Z3200 Encounter for pregnancy test, result unknown: Secondary | ICD-10-CM | POA: Diagnosis not present

## 2023-05-14 DIAGNOSIS — Z3491 Encounter for supervision of normal pregnancy, unspecified, first trimester: Secondary | ICD-10-CM | POA: Diagnosis not present

## 2023-05-14 DIAGNOSIS — O021 Missed abortion: Secondary | ICD-10-CM | POA: Diagnosis not present

## 2023-06-18 DIAGNOSIS — O039 Complete or unspecified spontaneous abortion without complication: Secondary | ICD-10-CM | POA: Diagnosis not present

## 2023-06-23 DIAGNOSIS — Z3183 Encounter for assisted reproductive fertility procedure cycle: Secondary | ICD-10-CM | POA: Diagnosis not present

## 2023-06-23 DIAGNOSIS — N96 Recurrent pregnancy loss: Secondary | ICD-10-CM | POA: Diagnosis not present

## 2023-06-23 DIAGNOSIS — E282 Polycystic ovarian syndrome: Secondary | ICD-10-CM | POA: Diagnosis not present

## 2023-06-23 DIAGNOSIS — Z319 Encounter for procreative management, unspecified: Secondary | ICD-10-CM | POA: Diagnosis not present

## 2023-06-24 DIAGNOSIS — Z31438 Encounter for other genetic testing of female for procreative management: Secondary | ICD-10-CM | POA: Diagnosis not present

## 2023-06-24 DIAGNOSIS — Z31430 Encounter of female for testing for genetic disease carrier status for procreative management: Secondary | ICD-10-CM | POA: Diagnosis not present

## 2023-07-01 ENCOUNTER — Other Ambulatory Visit: Payer: Self-pay

## 2023-07-01 MED ORDER — VITAMIN D3 1.25 MG (50000 UT) PO CAPS
1.0000 | ORAL_CAPSULE | ORAL | 1 refills | Status: DC
  Filled 2023-07-01: qty 8, 56d supply, fill #0

## 2023-07-01 MED ORDER — METFORMIN HCL 1000 MG PO TABS
1000.0000 mg | ORAL_TABLET | Freq: Two times a day (BID) | ORAL | 6 refills | Status: AC
  Filled 2023-07-01: qty 60, 30d supply, fill #0

## 2023-07-15 ENCOUNTER — Other Ambulatory Visit: Payer: Self-pay

## 2024-03-24 ENCOUNTER — Encounter: Payer: Self-pay | Admitting: Family Medicine

## 2024-03-24 ENCOUNTER — Ambulatory Visit: Payer: Self-pay | Admitting: Family Medicine

## 2024-03-24 ENCOUNTER — Other Ambulatory Visit (HOSPITAL_COMMUNITY): Payer: Self-pay

## 2024-03-24 ENCOUNTER — Other Ambulatory Visit: Payer: Self-pay

## 2024-03-24 ENCOUNTER — Ambulatory Visit: Admitting: Family Medicine

## 2024-03-24 VITALS — BP 124/86 | HR 81 | Temp 98.4°F | Ht 67.0 in | Wt 280.0 lb

## 2024-03-24 DIAGNOSIS — Z803 Family history of malignant neoplasm of breast: Secondary | ICD-10-CM | POA: Diagnosis not present

## 2024-03-24 DIAGNOSIS — Z0000 Encounter for general adult medical examination without abnormal findings: Secondary | ICD-10-CM

## 2024-03-24 DIAGNOSIS — E559 Vitamin D deficiency, unspecified: Secondary | ICD-10-CM

## 2024-03-24 DIAGNOSIS — E66813 Obesity, class 3: Secondary | ICD-10-CM | POA: Diagnosis not present

## 2024-03-24 DIAGNOSIS — Z862 Personal history of diseases of the blood and blood-forming organs and certain disorders involving the immune mechanism: Secondary | ICD-10-CM

## 2024-03-24 DIAGNOSIS — Z6841 Body Mass Index (BMI) 40.0 and over, adult: Secondary | ICD-10-CM | POA: Diagnosis not present

## 2024-03-24 DIAGNOSIS — Z6839 Body mass index (BMI) 39.0-39.9, adult: Secondary | ICD-10-CM

## 2024-03-24 LAB — CBC WITH DIFFERENTIAL/PLATELET
Basophils Absolute: 0 K/uL (ref 0.0–0.1)
Basophils Relative: 0.4 % (ref 0.0–3.0)
Eosinophils Absolute: 0 K/uL (ref 0.0–0.7)
Eosinophils Relative: 0.3 % (ref 0.0–5.0)
HCT: 39.1 % (ref 36.0–46.0)
Hemoglobin: 12.6 g/dL (ref 12.0–15.0)
Lymphocytes Relative: 25.8 % (ref 12.0–46.0)
Lymphs Abs: 1.7 K/uL (ref 0.7–4.0)
MCHC: 32.3 g/dL (ref 30.0–36.0)
MCV: 83.3 fl (ref 78.0–100.0)
Monocytes Absolute: 0.5 K/uL (ref 0.1–1.0)
Monocytes Relative: 7.3 % (ref 3.0–12.0)
Neutro Abs: 4.4 K/uL (ref 1.4–7.7)
Neutrophils Relative %: 66.2 % (ref 43.0–77.0)
Platelets: 277 K/uL (ref 150.0–400.0)
RBC: 4.7 Mil/uL (ref 3.87–5.11)
RDW: 14.1 % (ref 11.5–15.5)
WBC: 6.7 K/uL (ref 4.0–10.5)

## 2024-03-24 LAB — COMPREHENSIVE METABOLIC PANEL WITH GFR
ALT: 12 U/L (ref 0–35)
AST: 17 U/L (ref 0–37)
Albumin: 4.2 g/dL (ref 3.5–5.2)
Alkaline Phosphatase: 44 U/L (ref 39–117)
BUN: 16 mg/dL (ref 6–23)
CO2: 27 meq/L (ref 19–32)
Calcium: 9.3 mg/dL (ref 8.4–10.5)
Chloride: 105 meq/L (ref 96–112)
Creatinine, Ser: 0.69 mg/dL (ref 0.40–1.20)
GFR: 111.21 mL/min (ref >60.00)
Glucose, Bld: 101 mg/dL — ABNORMAL HIGH (ref 70–99)
Potassium: 5 meq/L (ref 3.5–5.1)
Sodium: 138 meq/L (ref 135–145)
Total Bilirubin: 0.4 mg/dL (ref 0.2–1.2)
Total Protein: 8 g/dL (ref 6.0–8.3)

## 2024-03-24 LAB — LIPID PANEL
Cholesterol: 190 mg/dL (ref 0–200)
HDL: 47 mg/dL (ref >39.00)
LDL Cholesterol: 128 mg/dL — ABNORMAL HIGH (ref 0–99)
NonHDL: 143.28
Total CHOL/HDL Ratio: 4
Triglycerides: 75 mg/dL (ref 0.0–149.0)
VLDL: 15 mg/dL (ref 0.0–40.0)

## 2024-03-24 LAB — T4, FREE: Free T4: 0.7 ng/dL (ref 0.60–1.60)

## 2024-03-24 LAB — VITAMIN D 25 HYDROXY (VIT D DEFICIENCY, FRACTURES): VITD: 18.32 ng/mL — ABNORMAL LOW (ref 30.00–100.00)

## 2024-03-24 LAB — TSH: TSH: 3.26 u[IU]/mL (ref 0.35–5.50)

## 2024-03-24 LAB — HEMOGLOBIN A1C: Hgb A1c MFr Bld: 6 % (ref 4.6–6.5)

## 2024-03-24 MED ORDER — VITAMIN D (ERGOCALCIFEROL) 1.25 MG (50000 UNIT) PO CAPS
50000.0000 [IU] | ORAL_CAPSULE | ORAL | 0 refills | Status: AC
  Filled 2024-03-24 (×2): qty 12, 84d supply, fill #0

## 2024-03-24 NOTE — Progress Notes (Signed)
 Established Patient Office Visit   Subjective  Patient ID: Cynthia Mckee, female    DOB: [birth year]  Age: 38 y.o. MRN: [id number]  Chief Complaint  Patient presents with   Annual Exam    Pt is a 38 year old female seen for CPE.  Patient states she is doing well overall.  Requesting mammogram sister was recently diagnosed at age 62 breast cancer and underwent double mastectomy, chemo, and XRT.  Pt's MGM also dx with breast cancer in her early 30s.  Pt denies current breast issues.  Has seen genetics in the past, but for fertility issues.  Pt did not realize she needed to take Ergocalciferol  for vit d def last yr, never picked up rx.  Pt is staying busy at work and with her daughter, Tressia will be starting the 2nd grade.    Patient Active Problem List   Diagnosis Date Noted   PCOS (polycystic ovarian syndrome) 03/20/2023   Eczema 01/12/2021   History of anemia 01/01/2018   Postpartum care following vaginal delivery 3/29 11/01/2016   Fetal problem in antepartum period in third trimester 10/14/2016   BMI 39.0-39.9,adult 12/23/2014   Iron deficiency anemia 12/23/2014   Past Medical History:  Diagnosis Date   Anemia    one year ago, last check was normal   Past Surgical History:  Procedure Laterality Date   DIAGNOSTIC LAPAROSCOPY WITH REMOVAL OF ECTOPIC PREGNANCY N/A 12/17/2018   Procedure: DIAGNOSTIC LAPAROSCOPY WITH REMOVAL OF ECTOPIC PREGNANCY, RIGHT SALPINGECTOMY;  Surgeon: Jayne Vonn DEL, MD;  Location: [facility];  Service: Gynecology;  Laterality: N/A;   DILATATION & CURRETTAGE/HYSTEROSCOPY WITH RESECTOCOPE N/A 03/16/2015   Procedure: DILATATION & CURETTAGE/HYSTEROSCOPY WITH RESECTOCOPE;  Surgeon: Dickie Carder, MD;  Location: [facility];  Service: Gynecology;  Laterality: N/A;   miscarriage     WISDOM TOOTH EXTRACTION  2011   Social History   Tobacco Use   Smoking status: Never   Smokeless tobacco: Never  Substance Use Topics   Drug use: No   Family  History  Problem Relation Age of Onset   Hypertension Mother    Cancer Sister        osteoscarcoma at age 72.  in remission x 10 yrs   Early death Sister    Miscarriages / [place] Sister    No Known Allergies  ROS Negative unless stated above    Objective:     BP 124/86 (BP Location: Left Arm, Patient Position: Sitting, Cuff Size: Large)   Pulse 81   Temp 98.4 F (36.9 C) (Oral)   Ht 5' 7 (1.702 m)   Wt 280 lb (127 kg)   LMP 03/02/2024   SpO2 98%   BMI 43.85 kg/m  BP Readings from Last 3 Encounters:  03/24/24 124/86  03/20/23 124/80  03/15/22 120/86   Wt Readings from Last 3 Encounters:  03/24/24 280 lb (127 kg)  03/20/23 275 lb 9.6 oz (125 kg)  03/15/22 272 lb (123.4 kg)      Physical Exam Constitutional:      Appearance: Normal appearance.  HENT:     Head: Normocephalic and atraumatic.     Right Ear: Tympanic membrane, ear canal and external ear normal.     Left Ear: Tympanic membrane, ear canal and external ear normal.     Nose: Nose normal.     Mouth/Throat:     Mouth: Mucous membranes are moist.     Pharynx: No oropharyngeal exudate or posterior oropharyngeal erythema.  Eyes:  General: No scleral icterus.    Extraocular Movements: Extraocular movements intact.     Conjunctiva/sclera: Conjunctivae normal.     Pupils: Pupils are equal, round, and reactive to light.  Neck:     Thyroid : No thyromegaly.     Vascular: No carotid bruit.  Cardiovascular:     Rate and Rhythm: Normal rate and regular rhythm.     Pulses: Normal pulses.     Heart sounds: Normal heart sounds. No murmur heard.    No friction rub.  Pulmonary:     Effort: Pulmonary effort is normal.     Breath sounds: Normal breath sounds. No wheezing, rhonchi or rales.  Abdominal:     General: Bowel sounds are normal.     Palpations: Abdomen is soft.     Tenderness: There is no abdominal tenderness.  Musculoskeletal:        General: No deformity. Normal range of motion.   Lymphadenopathy:     Cervical: No cervical adenopathy.  Skin:    General: Skin is warm and dry.     Findings: No lesion.  Neurological:     General: No focal deficit present.     Mental Status: She is alert and oriented to person, place, and time.  Psychiatric:        Mood and Affect: Mood normal.        Thought Content: Thought content normal.        03/20/2023    8:32 AM 03/15/2022    9:33 AM 01/12/2021    8:08 AM  Depression screen PHQ 2/9  Decreased Interest 0 0 0  Down, Depressed, Hopeless 0 0 0  PHQ - 2 Score 0 0 0  Altered sleeping 0 2 0  Tired, decreased energy 0 1 0  Change in appetite 0 0 0  Feeling bad or failure about yourself  0 0 0  Trouble concentrating 0 0 0  Moving slowly or fidgety/restless 0 0 0  Suicidal thoughts 0 0 0  PHQ-9 Score 0 3 0  Difficult doing work/chores  Not difficult at all       03/20/2023    8:32 AM 01/12/2021    8:09 AM  GAD 7 : Generalized Anxiety Score  Nervous, Anxious, on Edge 0 0  Control/stop worrying 0 0  Worry too much - different things 0 0  Trouble relaxing 0 0  Restless 0 0  Easily annoyed or irritable 0 0  Afraid - awful might happen 0 0  Total GAD 7 Score 0 0     No results found for any visits on 03/24/24.    Assessment & Plan:   Well adult exam -     CBC with Differential/Platelet; Future -     Comprehensive metabolic panel with GFR; Future -     Hemoglobin A1c; Future -     Lipid panel; Future -     T4, free; Future -     TSH; Future  Class 3 severe obesity without serious comorbidity with body mass index (BMI) of 40.0 to 44.9 in adult, unspecified obesity type  Family history of breast cancer -     Digital Screening Mammogram, Left and Right; Future  Vitamin D  deficiency -     VITAMIN D  25 Hydroxy (Vit-D Deficiency, Fractures); Future  Age-appropriate health screenings discussed.  Obtain labs.  Immunizations reviewed.  Consider influenza vaccine when available.  Order placed for screening  mammogram given family history of breast cancer in maternal grandmother and sister  in early 30s.  Pap with OB/GYN.  Return in about 1 year (around 03/24/2025) for physical.  Sooner if needed.  Clotilda JONELLE Single, MD

## 2024-08-13 ENCOUNTER — Other Ambulatory Visit: Payer: Self-pay
